# Patient Record
Sex: Female | Born: 1935 | Race: White | Hispanic: No | Smoking: Never smoker
Health system: Southern US, Community
[De-identification: ages and names within clinical notes are randomized; demographics above are authoritative.]

## PROBLEM LIST (undated history)

## (undated) ENCOUNTER — Emergency Department: Admission: EM | Disposition: A | Discharge status: Skilled Nursing Facility | Payer: Medicare Other

## (undated) DIAGNOSIS — I639 Cerebral infarction, unspecified: Secondary | ICD-10-CM

## (undated) DIAGNOSIS — E039 Hypothyroidism, unspecified: Secondary | ICD-10-CM

## (undated) DIAGNOSIS — E785 Hyperlipidemia, unspecified: Secondary | ICD-10-CM

## (undated) DIAGNOSIS — G459 Transient cerebral ischemic attack, unspecified: Secondary | ICD-10-CM

## (undated) DIAGNOSIS — M810 Age-related osteoporosis without current pathological fracture: Secondary | ICD-10-CM

## (undated) DIAGNOSIS — F329 Major depressive disorder, single episode, unspecified: Secondary | ICD-10-CM

## (undated) DIAGNOSIS — I739 Peripheral vascular disease, unspecified: Secondary | ICD-10-CM

## (undated) DIAGNOSIS — D649 Anemia, unspecified: Secondary | ICD-10-CM

## (undated) DIAGNOSIS — F209 Schizophrenia, unspecified: Secondary | ICD-10-CM

## (undated) DIAGNOSIS — F319 Bipolar disorder, unspecified: Secondary | ICD-10-CM

## (undated) DIAGNOSIS — N289 Disorder of kidney and ureter, unspecified: Secondary | ICD-10-CM

## (undated) HISTORY — PX: JOINT REPLACEMENT: SHX530

---

## 2004-06-16 ENCOUNTER — Ambulatory Visit: Payer: Self-pay | Admitting: Internal Medicine

## 2004-06-27 ENCOUNTER — Ambulatory Visit: Payer: Self-pay | Admitting: Internal Medicine

## 2004-07-21 ENCOUNTER — Ambulatory Visit: Payer: Self-pay | Admitting: Internal Medicine

## 2004-09-09 ENCOUNTER — Ambulatory Visit: Payer: Self-pay | Admitting: Internal Medicine

## 2004-10-14 ENCOUNTER — Other Ambulatory Visit: Payer: Self-pay

## 2004-10-14 ENCOUNTER — Ambulatory Visit: Payer: Self-pay | Admitting: Surgery

## 2004-10-20 ENCOUNTER — Ambulatory Visit: Payer: Self-pay | Admitting: Surgery

## 2004-12-23 ENCOUNTER — Ambulatory Visit: Payer: Self-pay | Admitting: Internal Medicine

## 2005-01-19 ENCOUNTER — Ambulatory Visit: Payer: Self-pay | Admitting: Internal Medicine

## 2005-12-21 ENCOUNTER — Ambulatory Visit: Payer: Self-pay | Admitting: Internal Medicine

## 2005-12-24 ENCOUNTER — Emergency Department: Payer: Self-pay | Admitting: Emergency Medicine

## 2005-12-24 ENCOUNTER — Other Ambulatory Visit: Payer: Self-pay

## 2006-09-13 ENCOUNTER — Ambulatory Visit: Payer: Self-pay | Admitting: Gastroenterology

## 2007-02-27 ENCOUNTER — Ambulatory Visit: Payer: Self-pay | Admitting: Internal Medicine

## 2007-05-10 ENCOUNTER — Emergency Department: Payer: Self-pay | Admitting: Emergency Medicine

## 2007-05-14 ENCOUNTER — Emergency Department: Payer: Self-pay | Admitting: Emergency Medicine

## 2007-05-14 ENCOUNTER — Other Ambulatory Visit: Payer: Self-pay

## 2007-05-17 ENCOUNTER — Other Ambulatory Visit: Payer: Self-pay

## 2007-05-17 ENCOUNTER — Inpatient Hospital Stay: Payer: Self-pay | Admitting: Internal Medicine

## 2007-07-12 ENCOUNTER — Emergency Department: Payer: Self-pay | Admitting: Emergency Medicine

## 2007-07-20 ENCOUNTER — Other Ambulatory Visit: Payer: Self-pay

## 2007-07-21 ENCOUNTER — Inpatient Hospital Stay: Payer: Self-pay | Admitting: Internal Medicine

## 2007-08-03 ENCOUNTER — Inpatient Hospital Stay: Payer: Self-pay | Admitting: Internal Medicine

## 2007-08-03 ENCOUNTER — Other Ambulatory Visit: Payer: Self-pay

## 2008-03-03 ENCOUNTER — Ambulatory Visit: Payer: Self-pay | Admitting: Internal Medicine

## 2008-08-16 ENCOUNTER — Emergency Department: Payer: Self-pay | Admitting: Emergency Medicine

## 2008-08-22 ENCOUNTER — Emergency Department: Payer: Self-pay | Admitting: Emergency Medicine

## 2008-11-04 ENCOUNTER — Inpatient Hospital Stay: Payer: Self-pay | Admitting: Internal Medicine

## 2008-11-04 ENCOUNTER — Ambulatory Visit: Payer: Self-pay | Admitting: Internal Medicine

## 2008-11-16 ENCOUNTER — Emergency Department: Payer: Self-pay | Admitting: Unknown Physician Specialty

## 2008-12-01 ENCOUNTER — Ambulatory Visit: Payer: Self-pay | Admitting: Internal Medicine

## 2009-04-30 ENCOUNTER — Inpatient Hospital Stay: Payer: Self-pay | Admitting: Internal Medicine

## 2009-05-10 ENCOUNTER — Emergency Department: Payer: Self-pay | Admitting: Internal Medicine

## 2009-05-19 ENCOUNTER — Inpatient Hospital Stay: Payer: Self-pay | Admitting: Psychiatry

## 2009-08-09 ENCOUNTER — Inpatient Hospital Stay: Payer: Self-pay | Admitting: Internal Medicine

## 2010-01-15 ENCOUNTER — Ambulatory Visit: Payer: Self-pay | Admitting: Family Medicine

## 2010-03-18 IMAGING — CR DG LUMBAR SPINE 2-3V
1 series · 3 of 3 positions shown · non-contrast
Comparison: none

REASON FOR EXAM: FALL, LBP
COMMENTS:   May transport without cardiac monitor

PROCEDURE:     DXR - DXR LUMBAR SPINE AP AND LATERAL  - August 08, 2009 [DATE]
RESULT:     Diffuse degenerative change is noted of the lumbar spine. There
is no evidence of fracture. Aortoiliac atherosclerotic vascular disease is
present.

[Series 1: view not recorded · 0.17mm/px · 3 of 3 slices shown]
[im 1/3]
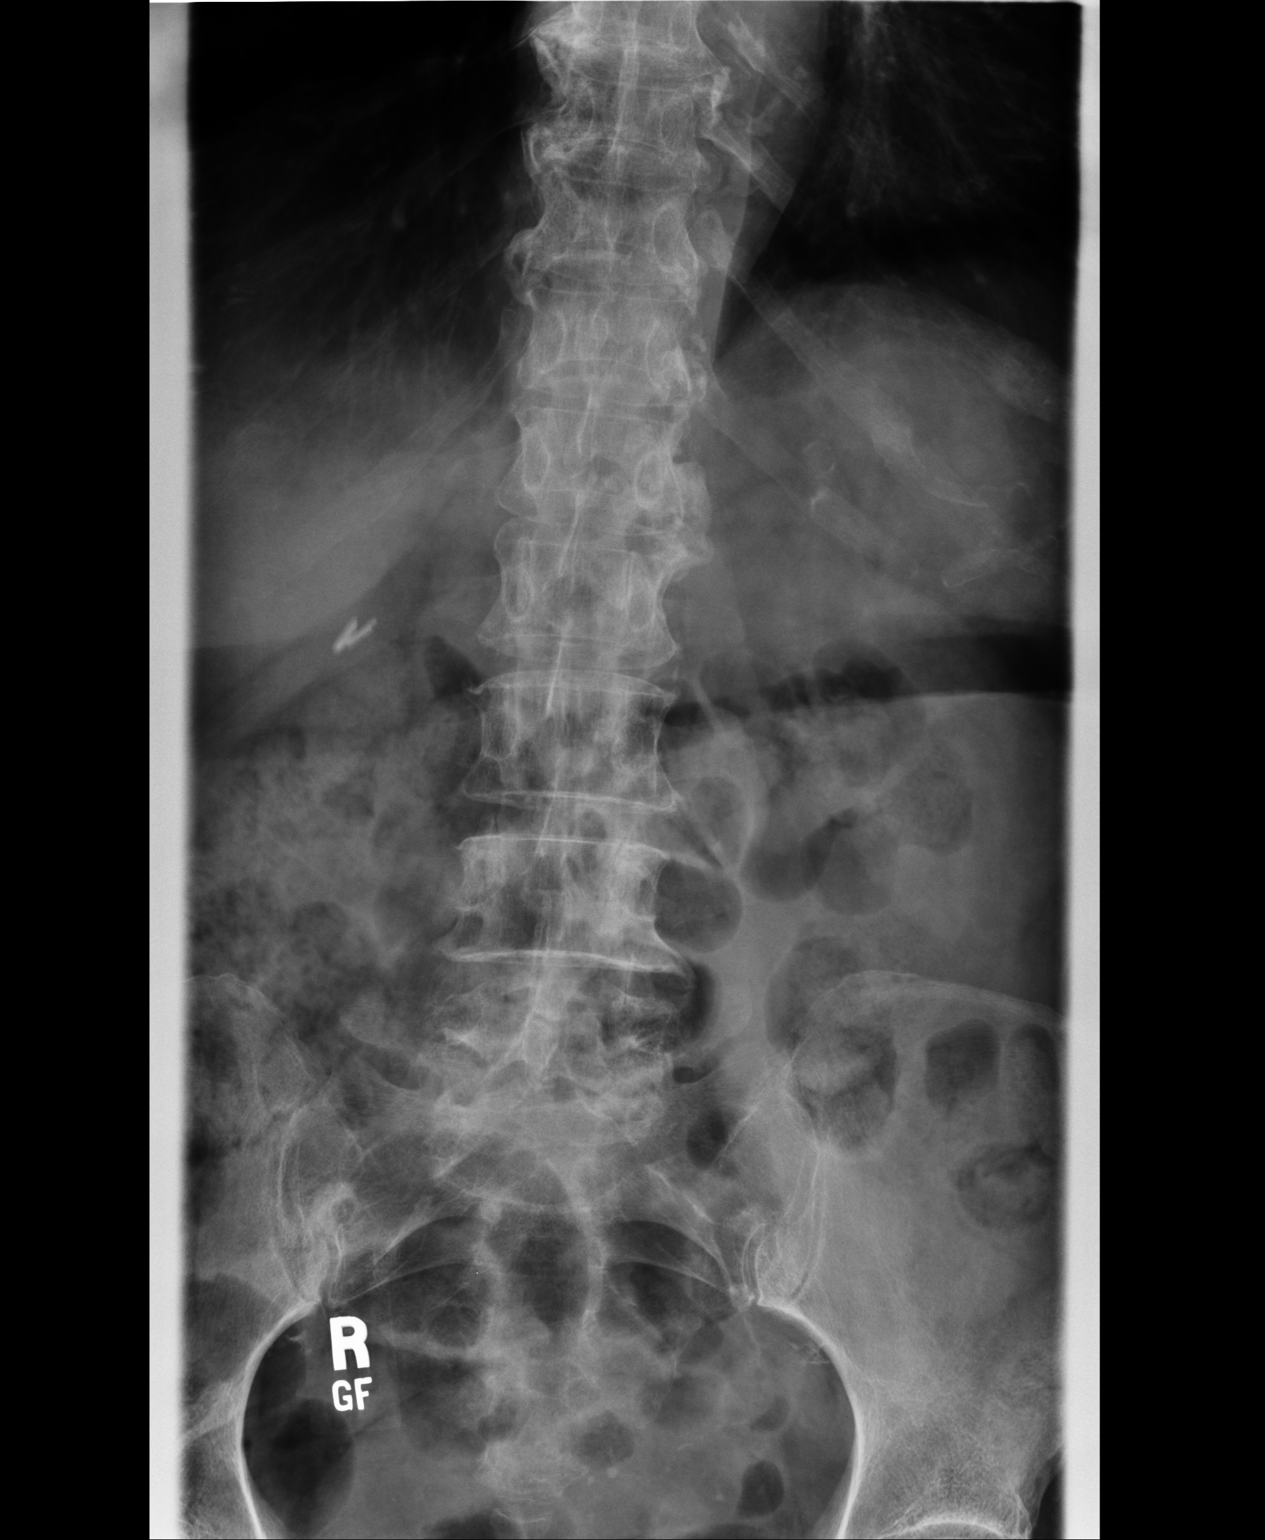
[im 2/3]
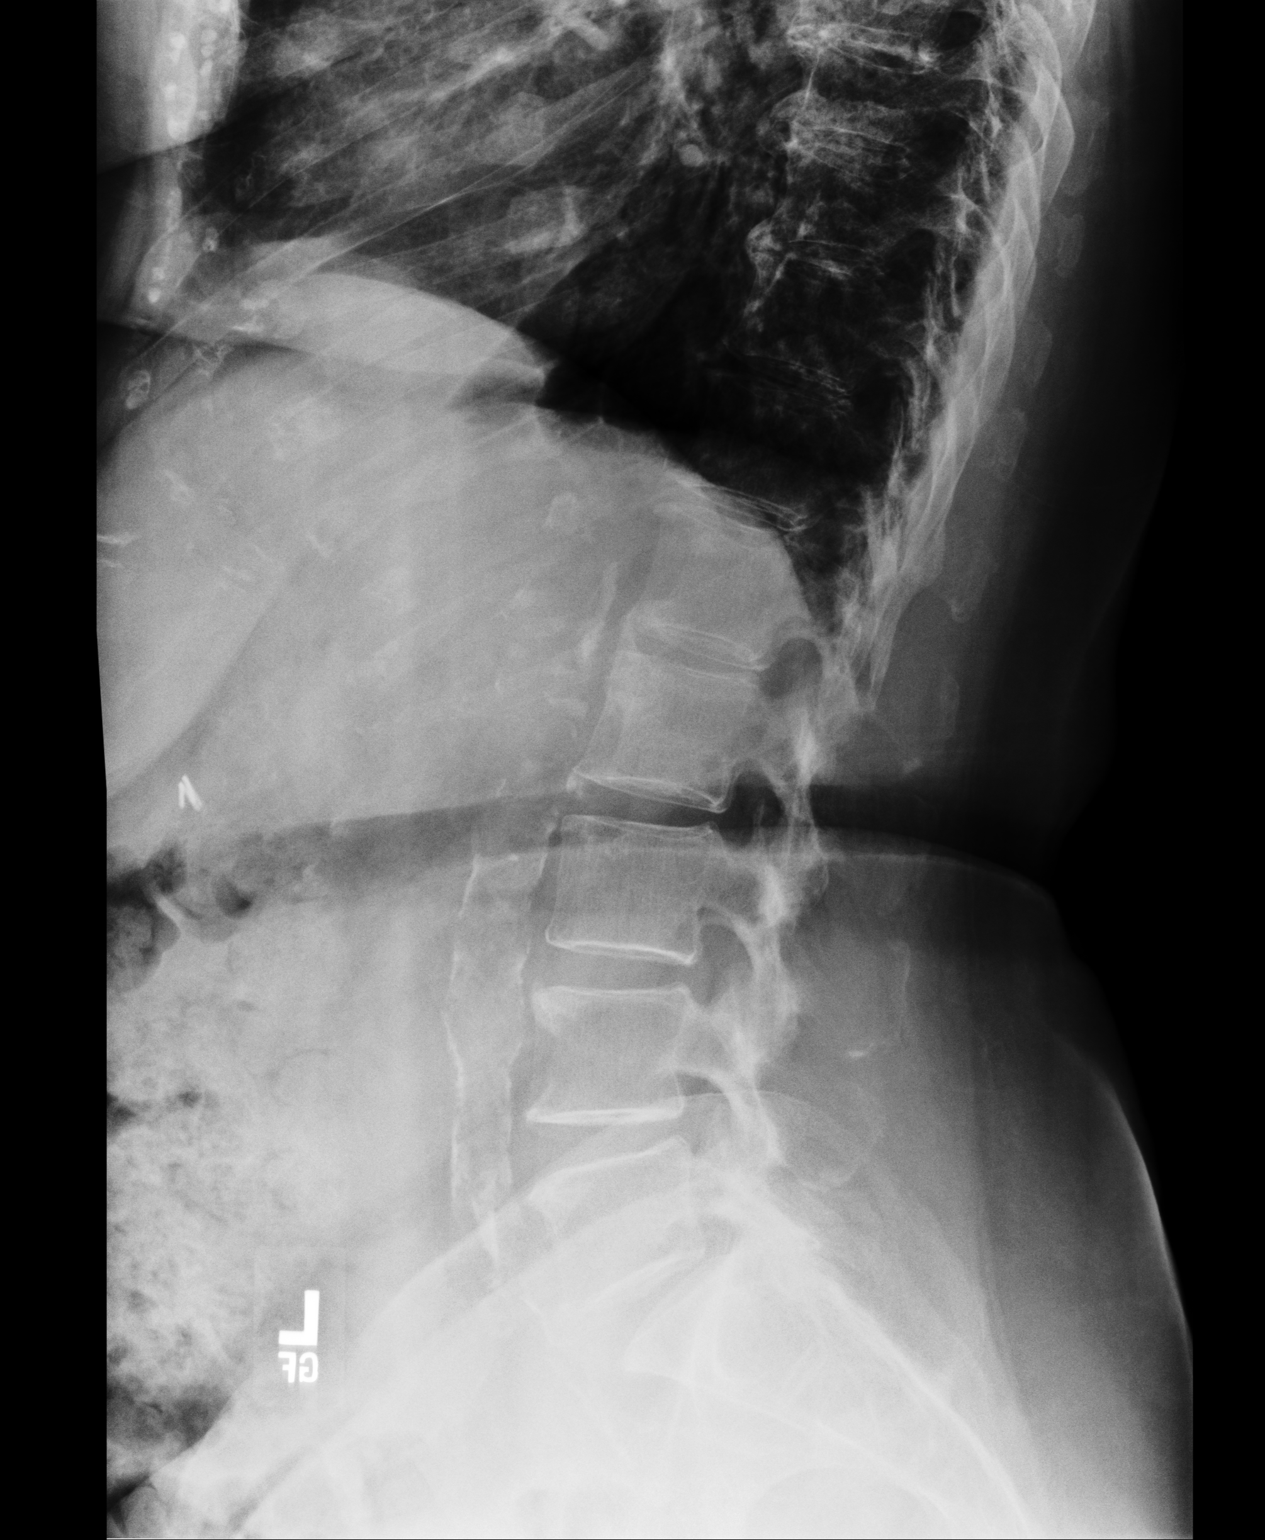
[im 3/3]
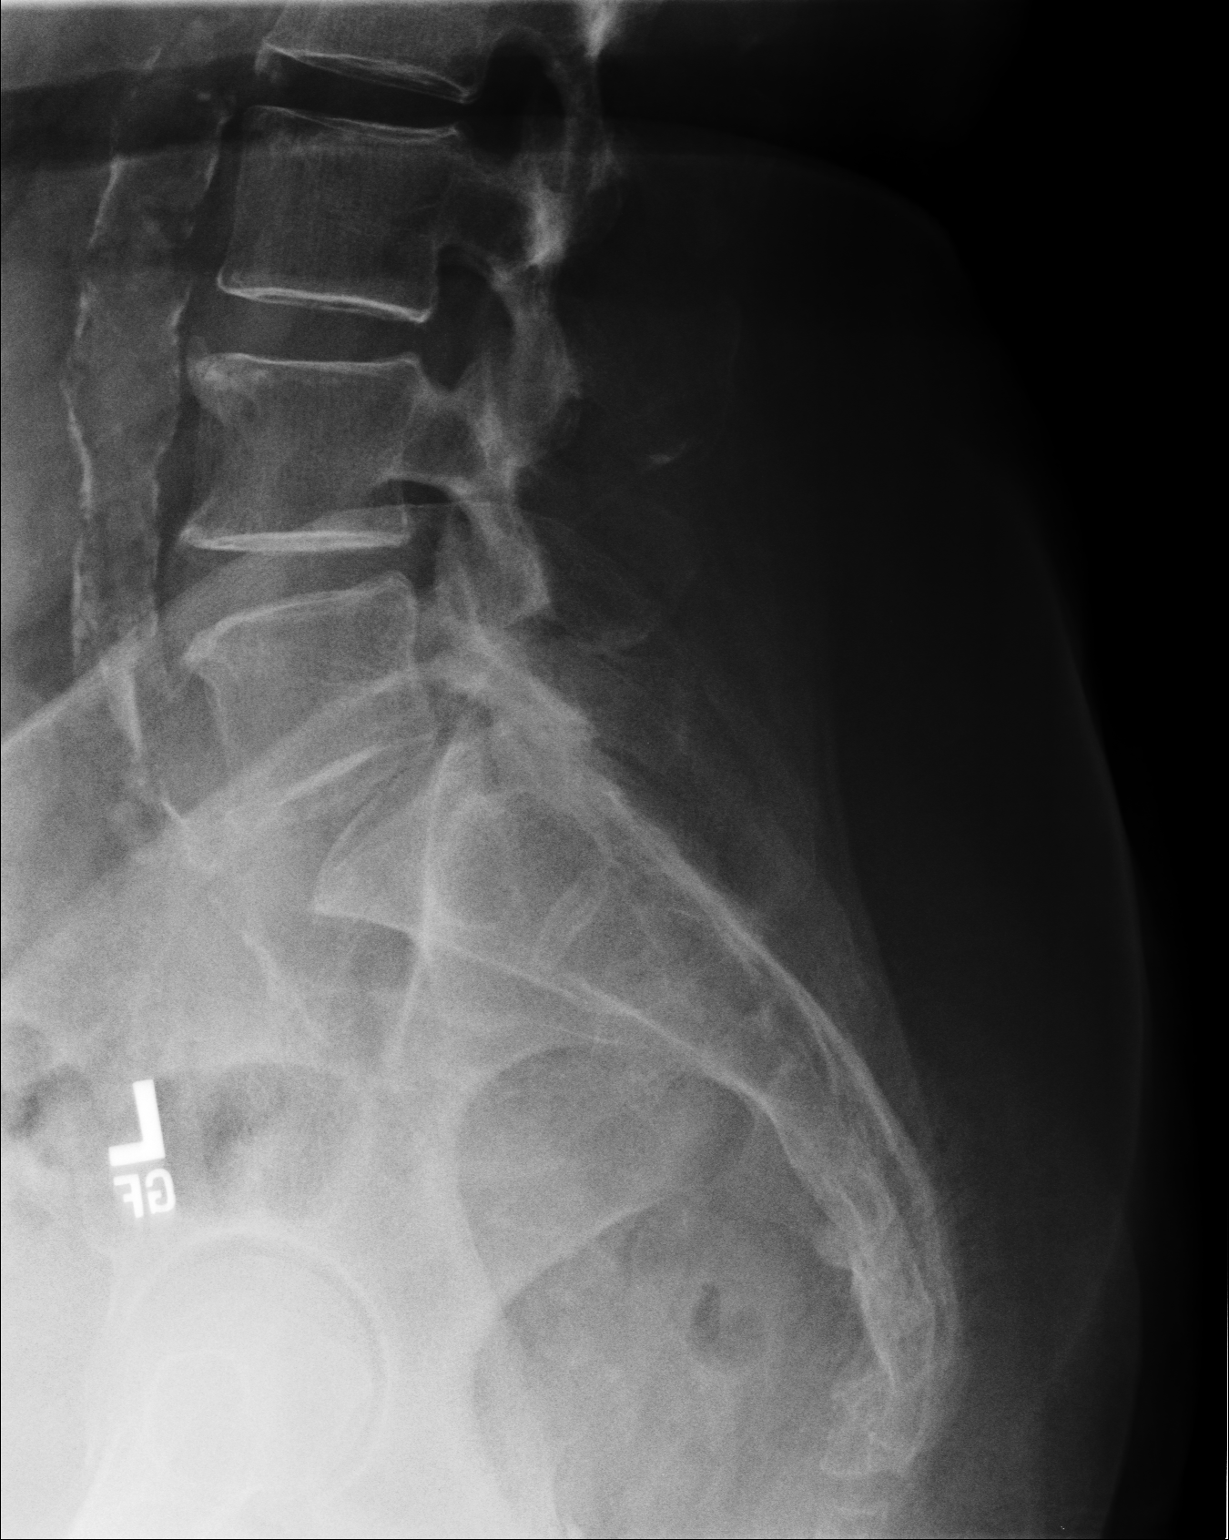

[3 of 3 positions shown; findings below may reference images not displayed]

IMPRESSION: Diffuse degenerative change, no acute abnormality.

## 2010-03-18 IMAGING — CT CT HEAD WITHOUT CONTRAST
2 series · 15 of 30 positions shown, 19 images · non-contrast
Comparison: none

REASON FOR EXAM: FALL, HIT HEAD
COMMENTS:   May transport without cardiac monitor

[Series 2: without · axial · non-contrast · 0.43mm/px · z∈[-140,-14]mm · 13 of 31 slices shown, 17 images]
[im 3/31  brain]
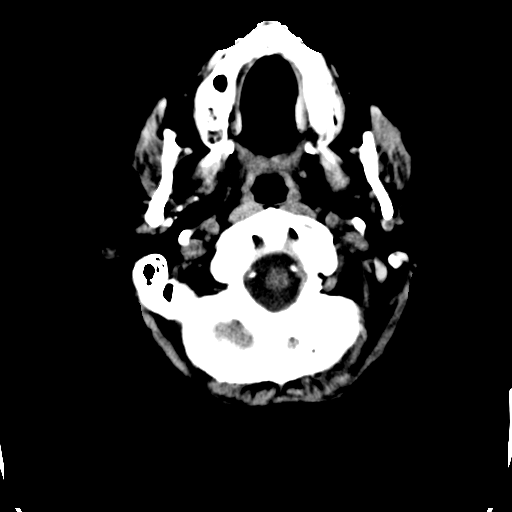
[im 3/31  bone]
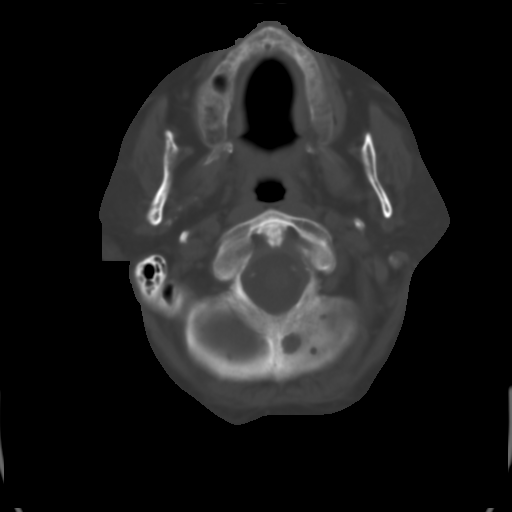
[im 5/31  brain]
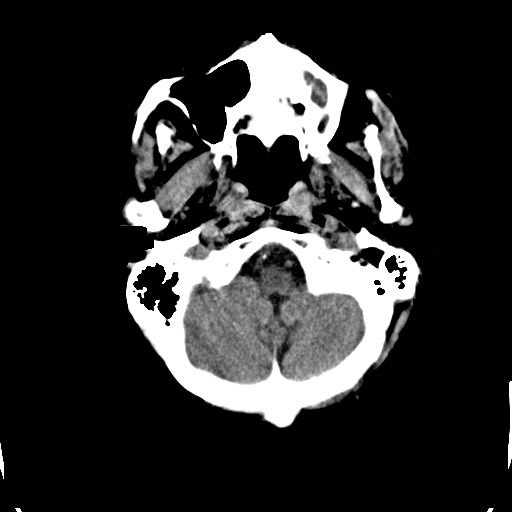
[im 7/31  brain]
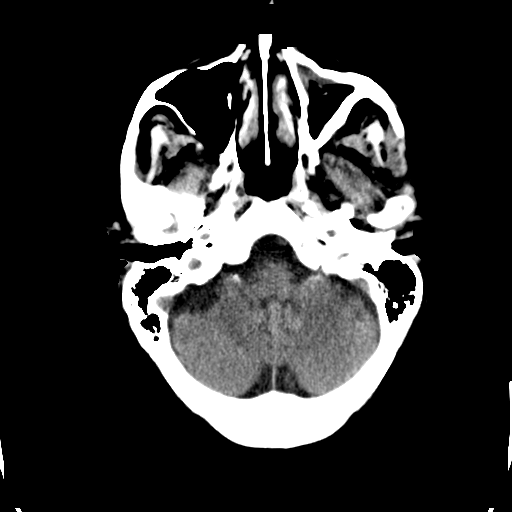
[im 9/31  brain]
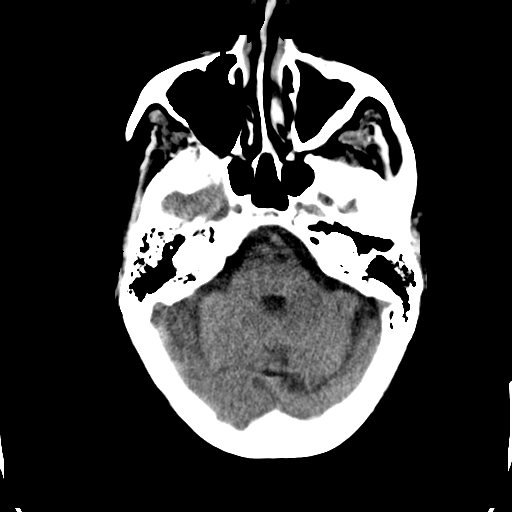
[im 11/31  brain]
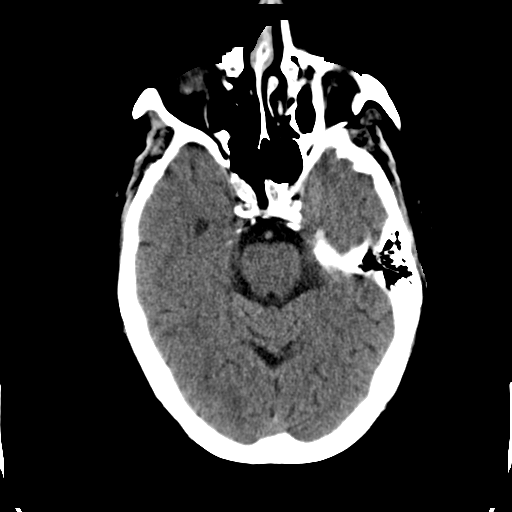
[im 11/31  bone]
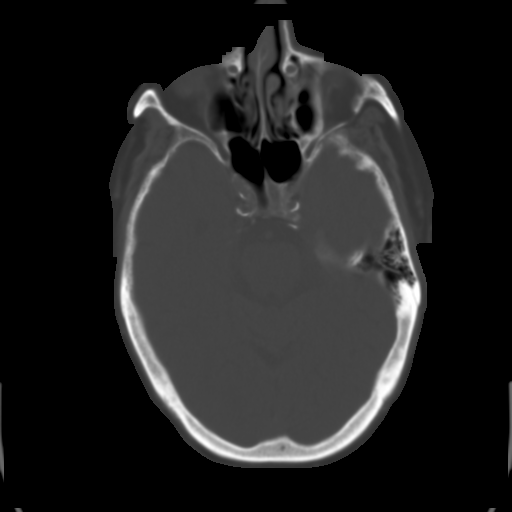
[im 13/31  brain]
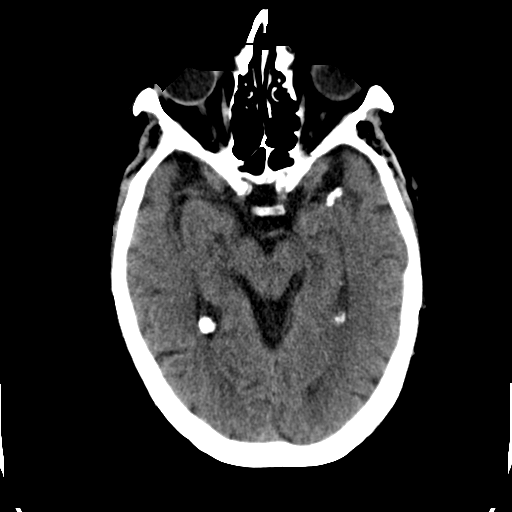
[im 16/31  brain]
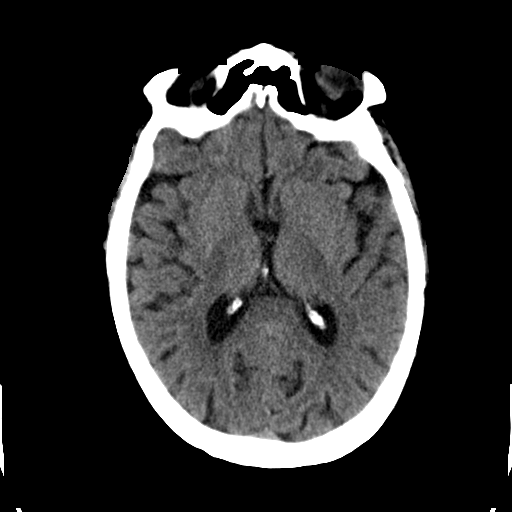
[im 18/31  brain]
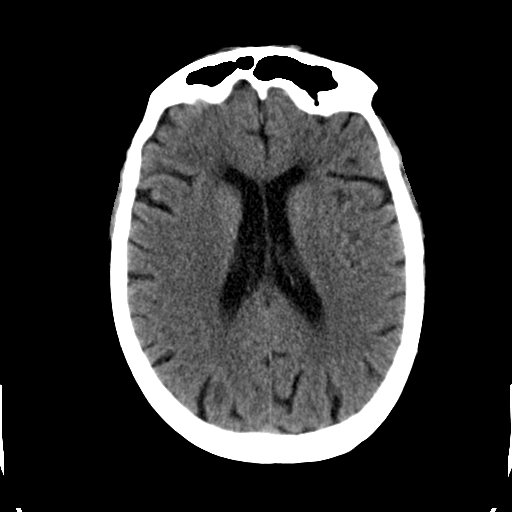
[im 20/31  brain]
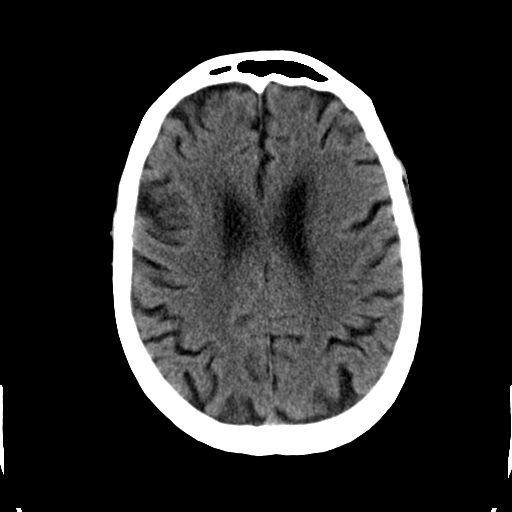
[im 20/31  bone]
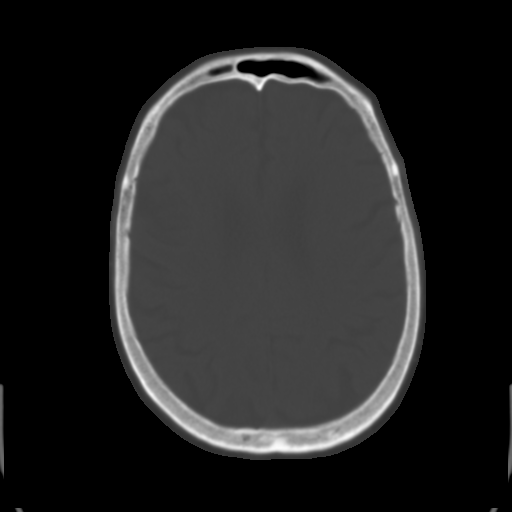
[im 22/31  brain]
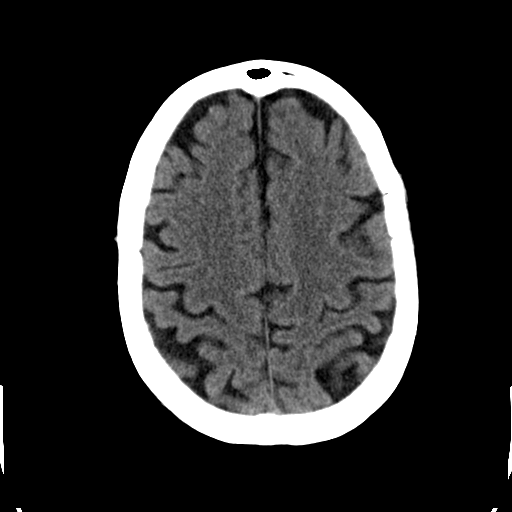
[im 24/31  brain]
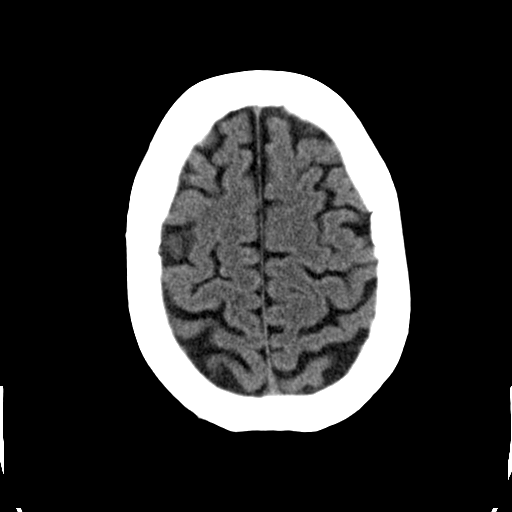
[im 26/31  brain]
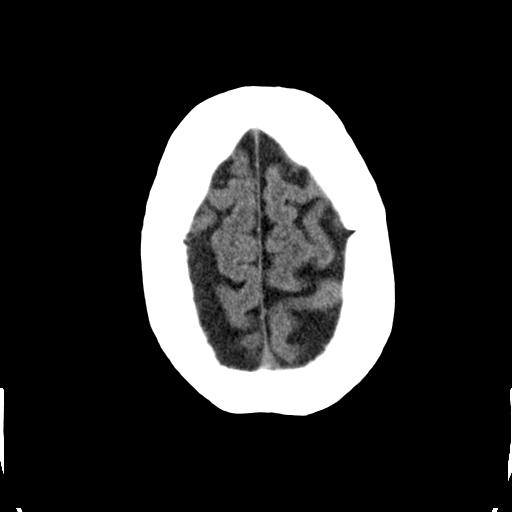
[im 28/31  brain]
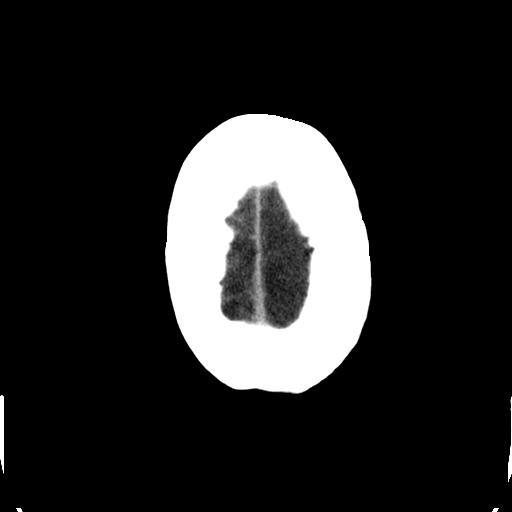
[im 28/31  bone]
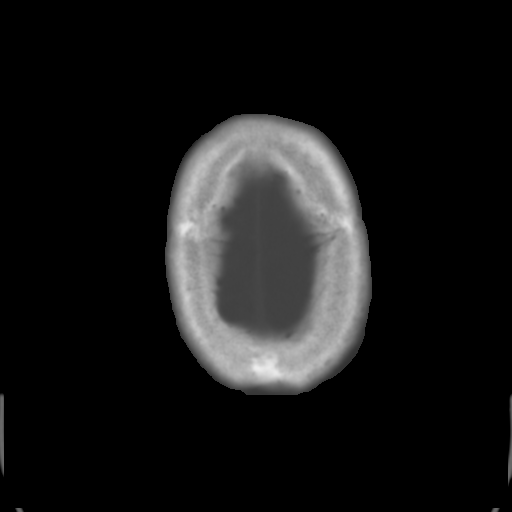

[Series 3: bone · axial · 0.43mm/px · z∈[-140,-120]mm · 2 of 31 slices shown]
[im 3/31  bone]
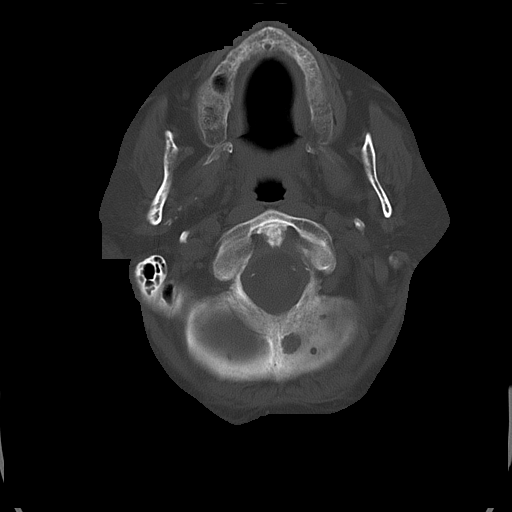
[im 7/31  bone]
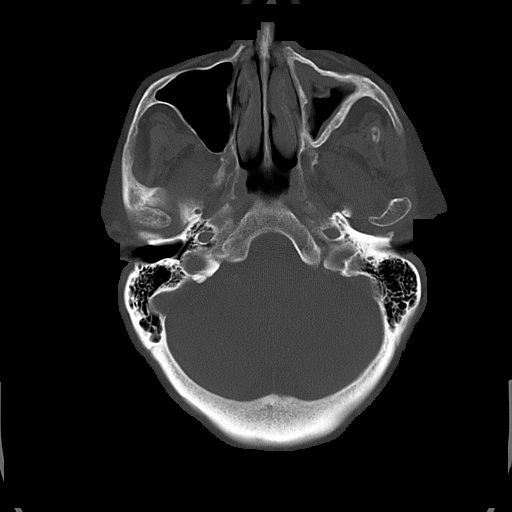

[15 of 30 positions shown; findings below may reference images not displayed]

PROCEDURE:     CT  - CT HEAD WITHOUT CONTRAST  - August 09, 2009 [DATE]

RESULT:     Axial noncontrast CT scanning was performed through the brain at
5 mm intervals and slice thicknesses.

The ventricles are normal in size and position. Mild age-appropriate
cerebral and cerebellar atrophy is present. There is no evidence of an acute
intracranial hemorrhage nor an acute ischemic infarction. Dense
calcification in the region of the left middle cerebral artery is present
but no surrounding hemorrhage is identified. The cerebellum and brainstem
exhibit no acute abnormality. At bone window settings there is
mucoperiosteal thickening within the left maxillary sinus. The mastoid air
cells are well pneumatized. There is no evidence of an acute skull fracture.
IMPRESSION: 1. I do not see evidence of an acute ischemic or hemorrhagic event.
2. There is no evidence of an intracranial mass nor hydrocephalus.
3. There is atherosclerotic calcification of portions of the left middle
cerebral artery.

A preliminary report was sent to the [HOSPITAL] the conclusion
of the study.

## 2010-03-20 IMAGING — CR DG CHEST 2V
1 series · 3 of 3 positions shown · non-contrast
Comparison: none

REASON FOR EXAM: fall
COMMENTS:

[Series 1: view not recorded · 0.17mm/px · 3 of 3 slices shown]
[im 1/3]
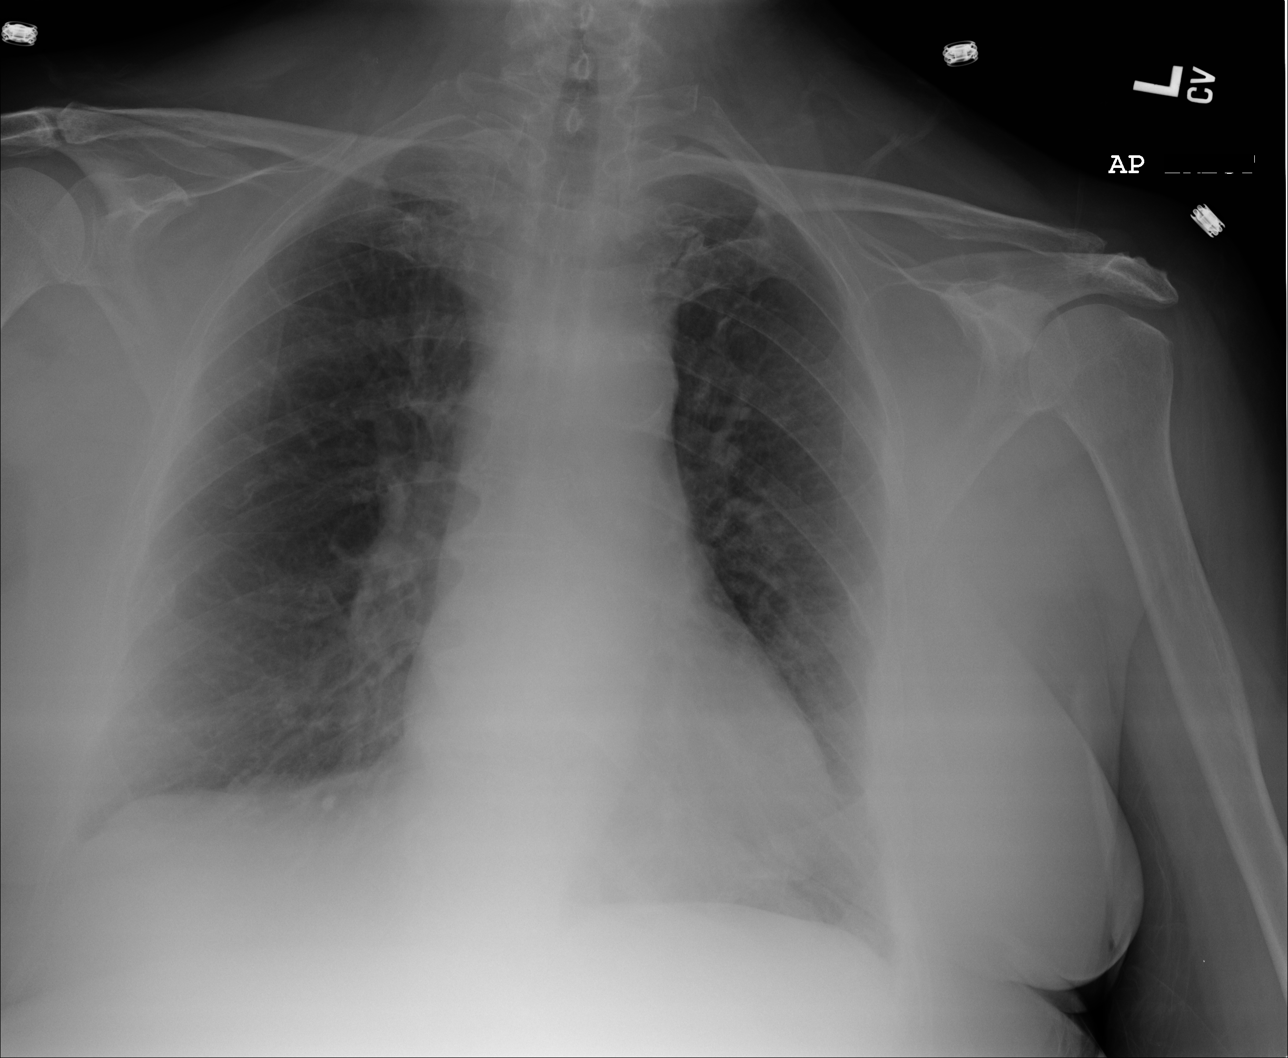
[im 2/3]
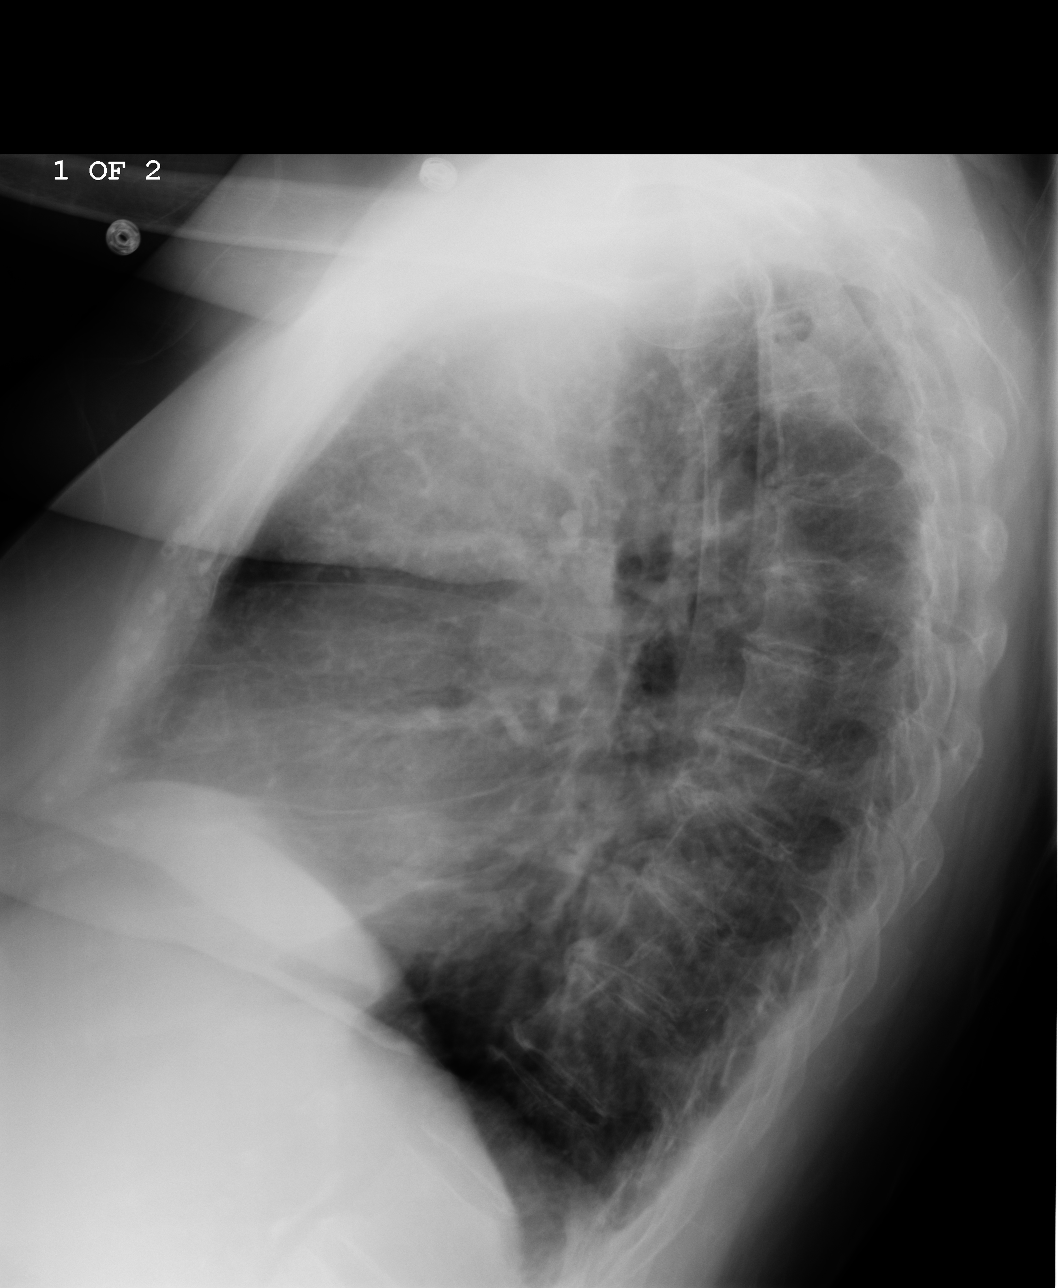
[im 3/3]
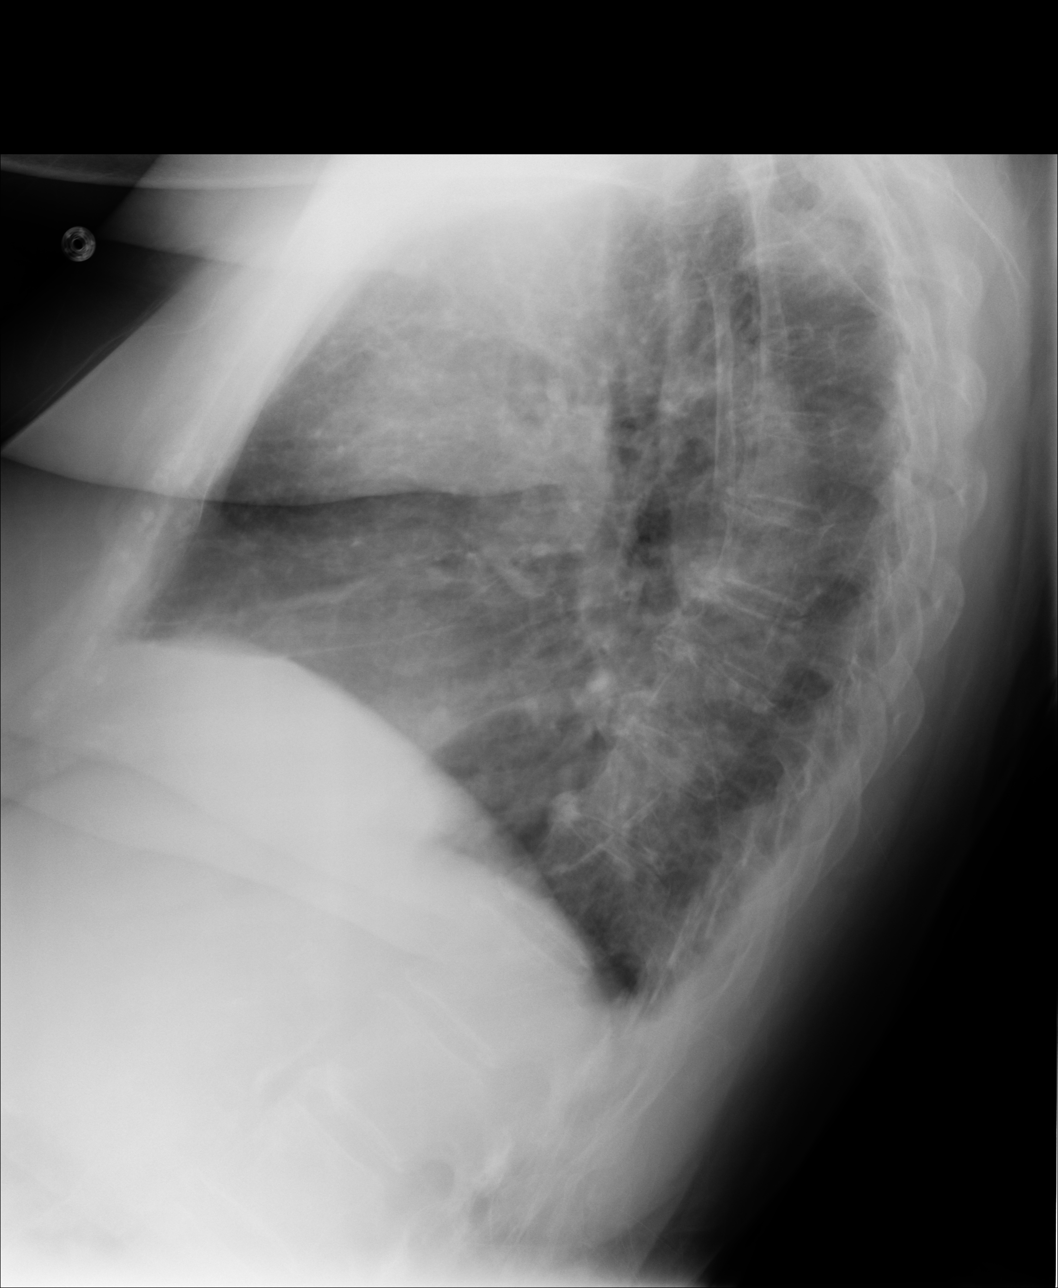

[3 of 3 positions shown; findings below may reference images not displayed]

PROCEDURE:     DXR - DXR CHEST PA (OR AP) AND LATERAL  - August 10, 2009  [DATE]

RESULT:     Comparison is made to the study 08 August, 2009.

The lungs are mildly hyperinflated. The interstitial markings are increased
but not greatly changed from yesterday's study allowing for differences in
radiographic technique. I see no definite pleural effusion. The cardiac
silhouette is top normal in size. The pulmonary vascularity is not engorged.
The bones are osteopenic. Mild loss of height of a mid thoracic vertebral
body is present.
IMPRESSION: Overall I do not see significant interval change in the
appearance of the chest since yesterday's study. There are findings
consistent with underlying COPD. I cannot exclude superimposed low-grade CHF
in the appropriate clinical setting.

## 2011-11-19 ENCOUNTER — Emergency Department: Payer: Self-pay | Admitting: Emergency Medicine

## 2011-11-19 LAB — CBC
HCT: 31.7 % — ABNORMAL LOW (ref 35.0–47.0)
HGB: 10.7 g/dL — ABNORMAL LOW (ref 12.0–16.0)
MCHC: 33.7 g/dL (ref 32.0–36.0)
MCV: 95 fL (ref 80–100)
RDW: 14.1 % (ref 11.5–14.5)
WBC: 5.3 10*3/uL (ref 3.6–11.0)

## 2011-11-19 LAB — URINALYSIS, COMPLETE
Bilirubin,UR: NEGATIVE
Glucose,UR: NEGATIVE mg/dL (ref 0–75)
Granular Cast: 13
Ph: 7 (ref 4.5–8.0)
RBC,UR: 25 /HPF (ref 0–5)
Specific Gravity: 1.01 (ref 1.003–1.030)
Squamous Epithelial: 3

## 2011-11-19 LAB — COMPREHENSIVE METABOLIC PANEL
Albumin: 3.5 g/dL (ref 3.4–5.0)
Alkaline Phosphatase: 51 U/L (ref 50–136)
Anion Gap: 11 (ref 7–16)
Bilirubin,Total: 0.4 mg/dL (ref 0.2–1.0)
Creatinine: 1.45 mg/dL — ABNORMAL HIGH (ref 0.60–1.30)
EGFR (African American): 45 — ABNORMAL LOW
EGFR (Non-African Amer.): 37 — ABNORMAL LOW
Osmolality: 292 (ref 275–301)
SGOT(AST): 21 U/L (ref 15–37)
SGPT (ALT): 15 U/L
Sodium: 142 mmol/L (ref 136–145)
Total Protein: 7 g/dL (ref 6.4–8.2)

## 2011-11-19 LAB — CK TOTAL AND CKMB (NOT AT ARMC)
CK, Total: 71 U/L (ref 21–215)
CK-MB: 1.3 ng/mL (ref 0.5–3.6)

## 2013-07-20 LAB — URINALYSIS, COMPLETE
Nitrite: NEGATIVE
Protein: 30
RBC,UR: 3 /HPF (ref 0–5)
Specific Gravity: 1.013 (ref 1.003–1.030)
Squamous Epithelial: 1
WBC UR: 137 /HPF (ref 0–5)

## 2013-07-20 LAB — COMPREHENSIVE METABOLIC PANEL
Albumin: 3.1 g/dL — ABNORMAL LOW (ref 3.4–5.0)
Alkaline Phosphatase: 66 U/L
BUN: 19 mg/dL — ABNORMAL HIGH (ref 7–18)
Bilirubin,Total: 0.7 mg/dL (ref 0.2–1.0)
Creatinine: 1.44 mg/dL — ABNORMAL HIGH (ref 0.60–1.30)
EGFR (Non-African Amer.): 33 — ABNORMAL LOW
Osmolality: 273 (ref 275–301)
Potassium: 3.3 mmol/L — ABNORMAL LOW (ref 3.5–5.1)
SGOT(AST): 26 U/L (ref 15–37)
Sodium: 135 mmol/L — ABNORMAL LOW (ref 136–145)

## 2013-07-20 LAB — CBC
HGB: 10.8 g/dL — ABNORMAL LOW (ref 12.0–16.0)
MCHC: 35 g/dL (ref 32.0–36.0)
MCV: 89 fL (ref 80–100)
Platelet: 144 10*3/uL — ABNORMAL LOW (ref 150–440)
RBC: 3.47 10*6/uL — ABNORMAL LOW (ref 3.80–5.20)
RDW: 13.8 % (ref 11.5–14.5)
WBC: 4.3 10*3/uL (ref 3.6–11.0)

## 2013-07-20 LAB — CK TOTAL AND CKMB (NOT AT ARMC)
CK, Total: 52 U/L (ref 21–215)
CK-MB: 1.1 ng/mL (ref 0.5–3.6)

## 2013-07-20 LAB — VALPROIC ACID LEVEL: Valproic Acid: 8 ug/mL — ABNORMAL LOW

## 2013-07-20 LAB — TROPONIN I: Troponin-I: <0.02 ng/mL

## 2013-07-21 ENCOUNTER — Observation Stay: Payer: Self-pay | Admitting: Internal Medicine

## 2013-07-26 LAB — CULTURE, BLOOD (SINGLE)

## 2014-04-18 ENCOUNTER — Emergency Department: Payer: Self-pay | Admitting: Emergency Medicine

## 2014-05-03 ENCOUNTER — Inpatient Hospital Stay: Payer: Self-pay | Admitting: Internal Medicine

## 2014-05-03 LAB — CBC WITH DIFFERENTIAL/PLATELET
Basophil #: 0 10*3/uL (ref 0.0–0.1)
Basophil %: 0.3 %
Eosinophil #: 0 10*3/uL (ref 0.0–0.7)
Eosinophil %: 0 %
HCT: 38.6 % (ref 35.0–47.0)
HGB: 13.2 g/dL (ref 12.0–16.0)
LYMPHS ABS: 0.9 10*3/uL — AB (ref 1.0–3.6)
LYMPHS PCT: 6.2 %
MCH: 30.5 pg (ref 26.0–34.0)
MCHC: 34.2 g/dL (ref 32.0–36.0)
MCV: 89 fL (ref 80–100)
MONO ABS: 0.6 x10 3/mm (ref 0.2–0.9)
Monocyte %: 4.4 %
NEUTROS ABS: 12.5 10*3/uL — AB (ref 1.4–6.5)
NEUTROS PCT: 89.1 %
Platelet: 189 10*3/uL (ref 150–440)
RBC: 4.33 10*6/uL (ref 3.80–5.20)
RDW: 14.2 % (ref 11.5–14.5)
WBC: 14 10*3/uL — ABNORMAL HIGH (ref 3.6–11.0)

## 2014-05-03 LAB — BASIC METABOLIC PANEL
Anion Gap: 10 (ref 7–16)
BUN: 32 mg/dL — ABNORMAL HIGH (ref 7–18)
CREATININE: 1.77 mg/dL — AB (ref 0.60–1.30)
Calcium, Total: 9.7 mg/dL (ref 8.5–10.1)
Chloride: 96 mmol/L — ABNORMAL LOW (ref 98–107)
Co2: 26 mmol/L (ref 21–32)
EGFR (Non-African Amer.): 26 — ABNORMAL LOW
GFR CALC AF AMER: 30 — AB
Glucose: 353 mg/dL — ABNORMAL HIGH (ref 65–99)
OSMOLALITY: 286 (ref 275–301)
Potassium: 3.7 mmol/L (ref 3.5–5.1)
SODIUM: 132 mmol/L — AB (ref 136–145)

## 2014-05-03 LAB — URINALYSIS, COMPLETE
BILIRUBIN, UR: NEGATIVE
Glucose,UR: >=500 mg/dL (ref 0–75)
Nitrite: POSITIVE
PH: 5 (ref 4.5–8.0)
Protein: 30
RBC,UR: 10 /HPF (ref 0–5)
Specific Gravity: 1.011 (ref 1.003–1.030)
Squamous Epithelial: 1
WBC UR: 246 /HPF (ref 0–5)

## 2014-05-03 LAB — APTT: Activated PTT: 31.5 secs (ref 23.6–35.9)

## 2014-05-03 LAB — TROPONIN I
TROPONIN-I: 0.31 ng/mL — AB
Troponin-I: 0.3 ng/mL — ABNORMAL HIGH
Troponin-I: 0.35 ng/mL — ABNORMAL HIGH

## 2014-05-03 LAB — PROTIME-INR
INR: 1.1
PROTHROMBIN TIME: 13.9 s (ref 11.5–14.7)

## 2014-05-03 LAB — CK-MB
CK-MB: 17.7 ng/mL — AB (ref 0.5–3.6)
CK-MB: 26.5 ng/mL — AB (ref 0.5–3.6)
CK-MB: 23.9 ng/mL — ABNORMAL HIGH (ref 0.5–3.6)

## 2014-05-03 LAB — CK: CK, TOTAL: 730 U/L — AB

## 2014-05-04 LAB — BASIC METABOLIC PANEL
Anion Gap: 9 (ref 7–16)
BUN: 16 mg/dL (ref 7–18)
CO2: 25 mmol/L (ref 21–32)
CREATININE: 0.92 mg/dL (ref 0.60–1.30)
Calcium, Total: 8.5 mg/dL (ref 8.5–10.1)
Chloride: 104 mmol/L (ref 98–107)
EGFR (African American): >60
EGFR (Non-African Amer.): 57 — ABNORMAL LOW
GLUCOSE: 204 mg/dL — AB (ref 65–99)
Osmolality: 283 (ref 275–301)
POTASSIUM: 3.1 mmol/L — AB (ref 3.5–5.1)
SODIUM: 138 mmol/L (ref 136–145)

## 2014-05-04 LAB — CBC WITH DIFFERENTIAL/PLATELET
BASOS PCT: 0.4 %
Basophil #: 0 10*3/uL (ref 0.0–0.1)
EOS ABS: 0.1 10*3/uL (ref 0.0–0.7)
EOS PCT: 1.1 %
HCT: 33.2 % — AB (ref 35.0–47.0)
HGB: 11.5 g/dL — ABNORMAL LOW (ref 12.0–16.0)
Lymphocyte #: 1.2 10*3/uL (ref 1.0–3.6)
Lymphocyte %: 16.7 %
MCH: 31.2 pg (ref 26.0–34.0)
MCHC: 34.6 g/dL (ref 32.0–36.0)
MCV: 90 fL (ref 80–100)
Monocyte #: 0.6 x10 3/mm (ref 0.2–0.9)
Monocyte %: 8.5 %
NEUTROS PCT: 73.3 %
Neutrophil #: 5.3 10*3/uL (ref 1.4–6.5)
Platelet: 146 10*3/uL — ABNORMAL LOW (ref 150–440)
RBC: 3.67 10*6/uL — AB (ref 3.80–5.20)
RDW: 13.6 % (ref 11.5–14.5)
WBC: 7.2 10*3/uL (ref 3.6–11.0)

## 2014-05-04 LAB — MAGNESIUM: Magnesium: 1.4 mg/dL — ABNORMAL LOW

## 2014-05-05 LAB — BASIC METABOLIC PANEL
Anion Gap: 6 — ABNORMAL LOW (ref 7–16)
BUN: 18 mg/dL (ref 7–18)
CHLORIDE: 104 mmol/L (ref 98–107)
Calcium, Total: 8.9 mg/dL (ref 8.5–10.1)
Co2: 29 mmol/L (ref 21–32)
Creatinine: 0.98 mg/dL (ref 0.60–1.30)
EGFR (Non-African Amer.): 53 — ABNORMAL LOW
Glucose: 169 mg/dL — ABNORMAL HIGH (ref 65–99)
Osmolality: 283 (ref 275–301)
POTASSIUM: 4 mmol/L (ref 3.5–5.1)
Sodium: 139 mmol/L (ref 136–145)

## 2014-05-05 LAB — CBC WITH DIFFERENTIAL/PLATELET
Basophil #: 0 10*3/uL (ref 0.0–0.1)
Basophil %: 0.9 %
EOS ABS: 0.1 10*3/uL (ref 0.0–0.7)
Eosinophil %: 2.6 %
HCT: 33.2 % — ABNORMAL LOW (ref 35.0–47.0)
HGB: 11 g/dL — AB (ref 12.0–16.0)
LYMPHS PCT: 22.6 %
Lymphocyte #: 1.2 10*3/uL (ref 1.0–3.6)
MCH: 30.1 pg (ref 26.0–34.0)
MCHC: 33.2 g/dL (ref 32.0–36.0)
MCV: 90 fL (ref 80–100)
MONOS PCT: 9.2 %
Monocyte #: 0.5 x10 3/mm (ref 0.2–0.9)
NEUTROS ABS: 3.3 10*3/uL (ref 1.4–6.5)
Neutrophil %: 64.7 %
PLATELETS: 153 10*3/uL (ref 150–440)
RBC: 3.67 10*6/uL — ABNORMAL LOW (ref 3.80–5.20)
RDW: 13.8 % (ref 11.5–14.5)
WBC: 5.1 10*3/uL (ref 3.6–11.0)

## 2014-05-05 LAB — MAGNESIUM: Magnesium: 1.5 mg/dL — ABNORMAL LOW

## 2014-05-05 LAB — CK: CK, TOTAL: 209 U/L — AB

## 2014-05-06 LAB — HEPATIC FUNCTION PANEL A (ARMC)
Albumin: 3 g/dL — ABNORMAL LOW (ref 3.4–5.0)
Alkaline Phosphatase: 80 U/L
Bilirubin,Total: 0.5 mg/dL (ref 0.2–1.0)
SGOT(AST): 24 U/L (ref 15–37)
SGPT (ALT): 15 U/L
TOTAL PROTEIN: 6.7 g/dL (ref 6.4–8.2)

## 2014-06-15 ENCOUNTER — Inpatient Hospital Stay: Payer: Self-pay | Admitting: Internal Medicine

## 2014-06-15 LAB — CBC WITH DIFFERENTIAL/PLATELET
BASOS ABS: 0 10*3/uL (ref 0.0–0.1)
Basophil %: 0.5 %
Eosinophil #: 0.1 10*3/uL (ref 0.0–0.7)
Eosinophil %: 1.6 %
HCT: 36.1 % (ref 35.0–47.0)
HGB: 12.2 g/dL (ref 12.0–16.0)
LYMPHS ABS: 1.4 10*3/uL (ref 1.0–3.6)
Lymphocyte %: 17.6 %
MCH: 30.6 pg (ref 26.0–34.0)
MCHC: 33.8 g/dL (ref 32.0–36.0)
MCV: 90 fL (ref 80–100)
Monocyte #: 0.5 x10 3/mm (ref 0.2–0.9)
Monocyte %: 6.6 %
Neutrophil #: 5.8 10*3/uL (ref 1.4–6.5)
Neutrophil %: 73.7 %
PLATELETS: 165 10*3/uL (ref 150–440)
RBC: 3.99 10*6/uL (ref 3.80–5.20)
RDW: 13.9 % (ref 11.5–14.5)
WBC: 7.8 10*3/uL (ref 3.6–11.0)

## 2014-06-15 LAB — COMPREHENSIVE METABOLIC PANEL
ALBUMIN: 3.5 g/dL (ref 3.4–5.0)
AST: 22 U/L (ref 15–37)
Alkaline Phosphatase: 87 U/L
Anion Gap: 9 (ref 7–16)
BILIRUBIN TOTAL: 0.5 mg/dL (ref 0.2–1.0)
BUN: 17 mg/dL (ref 7–18)
CO2: 26 mmol/L (ref 21–32)
Calcium, Total: 9.1 mg/dL (ref 8.5–10.1)
Chloride: 100 mmol/L (ref 98–107)
Creatinine: 0.99 mg/dL (ref 0.60–1.30)
EGFR (African American): >60
EGFR (Non-African Amer.): 57 — ABNORMAL LOW
GLUCOSE: 323 mg/dL — AB (ref 65–99)
Osmolality: 284 (ref 275–301)
POTASSIUM: 3.8 mmol/L (ref 3.5–5.1)
SGPT (ALT): 19 U/L
SODIUM: 135 mmol/L — AB (ref 136–145)
Total Protein: 7.2 g/dL (ref 6.4–8.2)

## 2014-06-15 LAB — PROTIME-INR
INR: 1
Prothrombin Time: 12.9 secs (ref 11.5–14.7)

## 2014-06-15 LAB — URINALYSIS, COMPLETE
Bilirubin,UR: NEGATIVE
Glucose,UR: >=500 mg/dL (ref 0–75)
KETONE: NEGATIVE
Nitrite: NEGATIVE
Ph: 6 (ref 4.5–8.0)
Protein: NEGATIVE
RBC,UR: 5 /HPF (ref 0–5)
SPECIFIC GRAVITY: 1.011 (ref 1.003–1.030)
WBC UR: 56 /HPF (ref 0–5)

## 2014-06-15 LAB — APTT: Activated PTT: 30.4 secs (ref 23.6–35.9)

## 2014-06-15 LAB — CK: CK, TOTAL: 103 U/L

## 2014-06-16 LAB — BASIC METABOLIC PANEL
Anion Gap: 11 (ref 7–16)
BUN: 14 mg/dL (ref 7–18)
CHLORIDE: 101 mmol/L (ref 98–107)
Calcium, Total: 9.1 mg/dL (ref 8.5–10.1)
Co2: 27 mmol/L (ref 21–32)
Creatinine: 0.9 mg/dL (ref 0.60–1.30)
EGFR (African American): >60
EGFR (Non-African Amer.): >60
GLUCOSE: 221 mg/dL — AB (ref 65–99)
Osmolality: 285 (ref 275–301)
POTASSIUM: 3.4 mmol/L — AB (ref 3.5–5.1)
Sodium: 139 mmol/L (ref 136–145)

## 2014-06-16 LAB — CBC WITH DIFFERENTIAL/PLATELET
Basophil #: 0 10*3/uL (ref 0.0–0.1)
Basophil %: 0.6 %
EOS ABS: 0.1 10*3/uL (ref 0.0–0.7)
Eosinophil %: 1.2 %
HCT: 35.3 % (ref 35.0–47.0)
HGB: 11.9 g/dL — AB (ref 12.0–16.0)
Lymphocyte #: 1.6 10*3/uL (ref 1.0–3.6)
Lymphocyte %: 19.4 %
MCH: 30.2 pg (ref 26.0–34.0)
MCHC: 33.6 g/dL (ref 32.0–36.0)
MCV: 90 fL (ref 80–100)
MONOS PCT: 8.4 %
Monocyte #: 0.7 x10 3/mm (ref 0.2–0.9)
Neutrophil #: 5.7 10*3/uL (ref 1.4–6.5)
Neutrophil %: 70.4 %
PLATELETS: 159 10*3/uL (ref 150–440)
RBC: 3.93 10*6/uL (ref 3.80–5.20)
RDW: 14.1 % (ref 11.5–14.5)
WBC: 8 10*3/uL (ref 3.6–11.0)

## 2014-06-16 LAB — POTASSIUM: Potassium: 5 mmol/L (ref 3.5–5.1)

## 2014-06-17 LAB — CBC WITH DIFFERENTIAL/PLATELET
Basophil #: 0 10*3/uL (ref 0.0–0.1)
Basophil %: 0.2 %
EOS PCT: 0.3 %
Eosinophil #: 0 10*3/uL (ref 0.0–0.7)
HCT: 30.6 % — ABNORMAL LOW (ref 35.0–47.0)
HGB: 10.4 g/dL — AB (ref 12.0–16.0)
Lymphocyte #: 1.2 10*3/uL (ref 1.0–3.6)
Lymphocyte %: 15.9 %
MCH: 30.6 pg (ref 26.0–34.0)
MCHC: 33.8 g/dL (ref 32.0–36.0)
MCV: 90 fL (ref 80–100)
Monocyte #: 0.7 x10 3/mm (ref 0.2–0.9)
Monocyte %: 9.6 %
NEUTROS PCT: 74 %
Neutrophil #: 5.7 10*3/uL (ref 1.4–6.5)
Platelet: 137 10*3/uL — ABNORMAL LOW (ref 150–440)
RBC: 3.39 10*6/uL — AB (ref 3.80–5.20)
RDW: 13.9 % (ref 11.5–14.5)
WBC: 7.6 10*3/uL (ref 3.6–11.0)

## 2014-06-17 LAB — BASIC METABOLIC PANEL
ANION GAP: 8 (ref 7–16)
BUN: 15 mg/dL (ref 7–18)
CALCIUM: 7.8 mg/dL — AB (ref 8.5–10.1)
CO2: 26 mmol/L (ref 21–32)
Chloride: 102 mmol/L (ref 98–107)
Creatinine: 0.96 mg/dL (ref 0.60–1.30)
EGFR (Non-African Amer.): 59 — ABNORMAL LOW
Glucose: 210 mg/dL — ABNORMAL HIGH (ref 65–99)
OSMOLALITY: 279 (ref 275–301)
Potassium: 3.7 mmol/L (ref 3.5–5.1)
SODIUM: 136 mmol/L (ref 136–145)

## 2014-06-18 LAB — URINALYSIS, COMPLETE
BLOOD: NEGATIVE
Bilirubin,UR: NEGATIVE
Glucose,UR: NEGATIVE mg/dL (ref 0–75)
Nitrite: NEGATIVE
PROTEIN: NEGATIVE
Ph: 5 (ref 4.5–8.0)
RBC,UR: 7 /HPF (ref 0–5)
Specific Gravity: 1.017 (ref 1.003–1.030)
WBC UR: 197 /HPF (ref 0–5)

## 2014-06-18 LAB — HEMOGLOBIN: HGB: 9.4 g/dL — ABNORMAL LOW (ref 12.0–16.0)

## 2014-06-19 LAB — BASIC METABOLIC PANEL
ANION GAP: 8 (ref 7–16)
BUN: 14 mg/dL (ref 7–18)
Calcium, Total: 8.3 mg/dL — ABNORMAL LOW (ref 8.5–10.1)
Chloride: 99 mmol/L (ref 98–107)
Co2: 28 mmol/L (ref 21–32)
Creatinine: 0.81 mg/dL (ref 0.60–1.30)
EGFR (Non-African Amer.): >60
Glucose: 121 mg/dL — ABNORMAL HIGH (ref 65–99)
Osmolality: 272 (ref 275–301)
Potassium: 3.6 mmol/L (ref 3.5–5.1)
Sodium: 135 mmol/L — ABNORMAL LOW (ref 136–145)

## 2014-06-23 LAB — URINE CULTURE

## 2014-10-26 ENCOUNTER — Emergency Department: Payer: Self-pay | Admitting: Emergency Medicine

## 2014-12-11 NOTE — Discharge Summary (Signed)
PATIENT NAME:  Leslie Savage, Leslie Savage MR#:  [id number] DATE OF BIRTH:  [birth year]  DATE OF ADMISSION:  07/21/2013 DATE OF DISCHARGE:  07/21/2013  ADMISSION DIAGNOSES: Acute encephalopathy secondary to urinary tract infection.   DISCHARGE DIAGNOSES:  1.  Acute encephalopathy on  chronic cognitive impairment secondary to urinary tract infection.  2.  Urinary tract infection.  3.  Chronic kidney disease, stage III. 4.  Chronic diastolic heart failure.  5.  History of bipolar disorder.   CONSULTATIONS: Psychiatry.   DIAGNOSTIC STUDIES:   1.  MRI of the brain showed no acute abnormality.  2.  Blood cultures are negative to date.   HOSPITAL COURSE: This is a 81 year old female who presented with slurred speech, confusion, and thought to have acute encephalopathy secondary to urinary tract infection. For further details, please refer to the H and P.  1.  Acute encephalopathy. This was thought to be secondary to urinary tract infection. She was started on Rocephin. She does have some baseline cognitive impairment as per my discussion with the son. She also has underlying psychiatric disorder. She underwent an MRI to evaluate for a CVA. This essentially was negative for an acute ischemic event. She also had psychiatric evaluation due to her underlying psychiatric diagnosis and possible medication changes. It appears the patient is actually not compliant with her baseline antipsychotic meds; therefore, Dr. Bary Leriche did not change any of her medications. She did mention that if the patient needs psychiatric treatment, the best place for her to be would be a geri psychiatric unit.  2.  Urinary tract infection. The patient continued on Rocephin. She will be discharged with Keflex.  3.  Chronic kidney disease, stage III.  4.  Chronic diastolic heart failure, which is stable.  5.  History of bipolar affective disorder. As mentioned, Dr. Bary Leriche from psychiatry did see the patient in consultation. The patient  was not cooperative with the psychiatrist. At this time, she did not believe that the patient required any psychiatric hospitalization and said that she could not change the patient's noncompliant behavior.   DISCHARGE MEDICATIONS:  1.  Keflex  571m t.i.d. for 8 days.  2.  Ferritin 10 mg daily.  3.  Fish oil 1 tablet daily. 4.  Vitamins B12 500 mcg daily.  5.  Actoplus Met 500/15 once per day.  6.  Ferrous sulfate 325 mg daily.  7.   40 mg b.i.d.  8.  Depakote 125 mg b.i.d.  9.  Seroquel 400 mg at bedtime.  10.  Seroquel XR 50 mg at bedtime.  11.  Artificial tears, affected eye daily.  12.  Cymbalta 50 mg daily.  13.  Vitamin D3 at 50,000 international units a month.  14.  Synthroid 175 mcg daily.  15.  Latuda 20 mg daily.  16.  Protonix 40 mg daily.  17.  Coreg 6.25 b.i.d.   DISCHARGE DIET: Low fat, low cholesterol diet.   DISCHARGE ACTIVITY: As tolerated.   DISCHARGE REFERRAL: Home health care.   DISCHARGE FOLLOW-UP: The patient will follow up with Dr. MFrazier Richardsin 1 week.   TIME SPENT: Approximately 35 minutes.   CONDITION: The patient is medically stable for discharge.    ____________________________ Omega Durante P. MBenjie Karvonen MD spm:np D: 07/21/2013 20:39:39 ET T: 07/21/2013 23:28:38 ET JOB#: [id number] cc: Bethan Adamek P. MBenjie Karvonen MD, <Dictator> MOcie Cornfield AOuida Sills MD  SDonell BeersMODY MD ELECTRONICALLY SIGNED 07/22/2013 12:54

## 2014-12-11 NOTE — H&P (Signed)
PATIENT NAME:  Leslie Savage, UNCAPHER MR#:  [id number] DATE OF BIRTH:  1927-12-07  DATE OF ADMISSION:  07/20/2013  DATE OF SERVICE: 07/21/2013   CHIEF COMPLAINT: Weakness, slurred speech.   HISTORY OF PRESENTING ILLNESS: An 81 year old Caucasian female patient with history of congestive heart failure, bipolar, mania, CVA, who presents to the Emergency Room, brought in by the son from assisted living, with complaints of slurred speech and severe weakness, which started earlier today afternoon. The patient went out to [employer] with her son, was in a wheelchair, suddenly felt weak, onset of slurred speech, and went back to assisted living facility hoping she would feel better after she got some sleep, but was noticed to have hypotension with systolic in the 80s and brought to the Emergency Room. Here, the patient's blood pressure is still low in the 90s. Has urinary tract infection and is being admitted to the hospitalist service. She had a CT scan of the head without contrast, which shows atrophy, no acute changes.   The patient does not complain of any focal weakness. Says she has had poor appetite over the past few days. Has significant slurred speech, and as per the son she has a clear speech normally.   PAST MEDICAL HISTORY: 1.  Hypothyroidism.  2.  Hyponatremia.  3.  Diastolic congestive heart failure.  4.  Osteoarthritis.  5.  Cellulitis.  6.  Pneumonia.  7.  CVA.  8.  Hypercholesterolemia.  9.   Osteoporosis.  10.  Gastritis.  11.  Diabetes mellitus type 2.  12.  Bipolar disorder.    PAST SURGICAL HISTORY:  1.  D and C.  2.  Cholecystectomy. 3.  Cataract extraction.   ALLERGIES: RISPERDAL, CRESTOR, CIPROFLOXACIN.  FAMILY HISTORY: Colon cancer.   SOCIAL HISTORY: The patient lives in [facility] Her son is her power of attorney. She does not smoke. No alcohol. Ambulates with a walker normally.   REVIEW OF SYSTEMS: CONSTITUTIONAL: Complains of fatigue, poor appetite.  EYES: No  blurred vision, pain or redness.  ENT: No tinnitus, ear pain, hearing loss.  RESPIRATORY: No cough, wheezing, or hemoptysis present.  CARDIOVASCULAR: Chest pain, orthopnea. Has congestive heart failure.  GASTROINTESTINAL: No nausea, vomiting, diarrhea, abdominal pain.  GENITOURINARY: Has a urinary tract infection.  ENDOCRINE: No nocturia, thyroid problems.  HEMATOLOGIC AND LYMPHATIC:  No anemia, easy bruising, bleeding.  INTEGUMENTARY: No acne, rash, lesions.  MUSCULOSKELETAL: Has some arthritis.  NEUROLOGIC: Had slurred speech. No focal weakness.  PSYCHIATRIC: Has bipolar disorder. Multiple psychiatric admissions in the past.   HOME MEDICATIONS: Home medication list is presently not available; reviewed medications from prior discharge summary.   PHYSICAL EXAMINATION: VITAL SIGNS: Temperature 97.4, pulse of 69, blood pressure of 96/54, saturating 95% on room air.  GENERAL: Elderly Caucasian female patient, sitting up in bed with significant slurred speech, but seems coherent, conversational.  PSYCHIATRIC: Alert and awake, oriented to place and person but not to time.  HEENT: Atraumatic, normocephalic. Oral mucosa dry and pink. No oral ulcers or thrush. External ears and nose normal. Pallor positive.  NECK: Supple. No thyromegaly or palpable lymph nodes. Trachea midline. No carotid bruit, JVD.  CARDIOVASCULAR: S1, S2, without any murmurs. Peripheral pulses 2+. No edema.  RESPIRATORY:  Normal work of breathing. Clear to auscultation both sides. GASTROINTESTINAL:  Soft abdomen, nontender. Bowel sounds present. No organomegaly palpable.  GENITOURINARY: No CVA tenderness or bladder distention.  SKIN: Warm and dry. No petechiae or rash. MUSCULOSKELETAL: No joint swelling, redness, effusion of the large  joints. Normal muscle tone.  NEUROLOGICAL: Motor strength 4+/5 in upper and lower extremities. Significant slurred speech. Cranial nerves II through XII are grossly intact. Reflexes 2+.   LYMPHATIC: No cervical lymphadenopathy.   LABORATORY STUDIES: Show glucose 106, BUN 19, creatinine 1.44, sodium 135, potassium 3.3, with GFR of 33. AST, ALT, alkaline phosphatase, bilirubin normal. Troponin less than 0.02. Valproic acid level was 8. WBC 4.3, hemoglobin 10.8, platelets of 144. Urinalysis shows 3+ bacteria and 137 WBCs.   CT scan of the head shows no acute abnormalities other than some atrophy.   Chest x-ray, PA and lateral, shows mild right basilar atelectasis. No acute changes.   EKG shows normal sinus rhythm, no acute changes.   ASSESSMENT AND PLAN: 1.  Urinary tract infection with acute encephalopathy. The patient does have hypotension in the 90s, was lower at 80s in the assisted living facility. Will start on IV fluids, monitor closely for any worsening. We will send for urine and blood cultures. Start on ceftriaxone for the urinary tract infection. She does not have any immunosuppression or chronic Foley.  2.  Slurred speech seems to be acute onset. Although urinary tract infection could cause this, she has history of cerebrovascular accident. CT scan does not show anything acute. We will check an MRI of the brain. If there is an acute stroke on MRI of the brain, she will need further work-up with carotid Dopplers and echocardiogram. Presently awaiting MRI results. Will put her on neuro checks. Hopefully, patient will improve with IV antibiotics as her urinary tract infection gets better. The patient will be on a tele floor, with neuro checks q.4 hours and fall precautions.  3.  Chronic diastolic congestive heart failure, stable. Monitor for fluid overload, as she will be on IV fluids.  4.  Chronic kidney disease, stage 3.  Patient's baseline creatinine seems be around 1.45, stable.  5.  Hypokalemia. Replace orally.  6.  Anemia of chronic disease, stable.  7.  Bipolar disorder. Will await her complete medication list and restart them when available. 8.  Deep venous thrombosis  prophylaxis with heparin.   CODE STATUS: Full code.   Time spent today on this case was 50 minutes.    ____________________________ Molinda BailiffSrikar R. Dragan Tamburrino, MD srs:cg D: 07/21/2013 00:52:58 ET T: 07/21/2013 01:26:28 ET JOB#: [id number]  cc: Wardell HeathSrikar R. Esley Brooking, MD, <Dictator> Teena Iraniavid M. Terance HartBronstein, MD Orie FishermanSRIKAR R Astaria Nanez MD ELECTRONICALLY SIGNED 07/21/2013 1:45

## 2014-12-11 NOTE — Consult Note (Signed)
Brief Consult Note: Diagnosis: Bipolar disorder in remission.   Patient was seen by consultant.   Discussed with Attending MD.   Comments: The patient has a h/o bipolar disorder. [facility] for man[facility]. She was discharged on a combination of Depakote and Geodon. Compliance was a problem then as im Geodone was prescribed if the patient refused oral. The patient is currently on a combination of Latuda, Seroquel and Depakote. Gaynelle AduSher has not been compliant with Depakote as indicated by low VPA level.   The patient was unwilling to talk to me "go away". She does not appear to be manic. There is a long h/o treatment noncompliance. I do not bel;ieve this can be easily changed.   She does not require psychiatric hospitalization at present. If needed, she should be referred to Geropsychiatry Unit. [place] one [place] in [place].  Electronic Signatures: Kristine LineaPucilowska, Jayvon Mounger (MD)  (Signed 01-Dec-14 15:07)  Authored: Brief Consult Note   Last Updated: 01-Dec-14 15:07 by Kristine LineaPucilowska, Kiauna Zywicki (MD)

## 2014-12-12 NOTE — Consult Note (Signed)
   Present Illness Asked to see pt who is 81 yo female with history of recent unwitness fall and was noted to have evidence of rhabdomyolysis with mild elevated serum troponin at 0.3. Her cpk/mb is also elevated. 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 Pt was somewhat agitated and beligerant when I attempted to see her and demanded i leave the room. Exam not able to be comleted due to patient lack of cooperation and combativenes.   Physical Exam:  GEN disheveled   PSYCH poor insight, agitated   Review of Systems:  Subjective/Chief Complaint deneis complaints and demanded i leave the room   Medications/Allergies Reviewed Medications/Allergies reviewed   EKG:  EKG NSR   Interpretation nssttw changes    Risperdal: Rash  Crestor: Unknown  Cipro: Unknown  Other- Explain in Comments Line: Unknown   Impression 81 yo female with history of phsyciatric illness including bipolar disorder and schizophrenia who was admitted after suffering an unwitnessed fall.. Her serum troponin is elevated likley secondary to rhabdomyolysis. Not able to examine the patient due to combativeness and her asking me to leave the room. Does not appear to have had an acute coronary event. Troponin likely secondary to rhabdo. Not candidate for invasive cardiac evaluation   Plan 1. Gentle hydrateion 2. Not candidate for invasive evauation 3. Does not apepar to have had an acute coronary event. Would treate for rhabdo and ambulate as tolerated. 4. WIll follow up if patient allows.   Electronic Signatures: Dalia HeadingFath, Nirvaan Frett A (MD)  (Signed 13-Sep-15 11:38)  Authored: General Aspect/Present Illness, History and Physical Exam, Review of System, EKG , Allergies, Impression/Plan   Last Updated: 13-Sep-15 11:38 by Dalia HeadingFath, Maelys Kinnick A (MD)

## 2014-12-12 NOTE — Consult Note (Signed)
Brief Consult Note: Diagnosis: bipolar disorder.   Patient was seen by consultant.   Consult note dictated.   Recommend further assessment or treatment.   Orders entered.   Comments: PSychiatry: Patient seen and chart reviewed and note dictated. Hx bipolar disorder. May be depressed. Hard to assess. Uncooperative. Changed some med orders. Ordered some labs. Will follow.  Electronic Signatures: Wilmore Holsomback, Jackquline DenmarkJohn T (MD)  (Signed 16-Sep-15 15:04)  Authored: Brief Consult Note   Last Updated: 16-Sep-15 15:04 by Audery Amellapacs, Lyne Khurana T (MD)

## 2014-12-12 NOTE — Consult Note (Signed)
Brief Consult Note: Diagnosis: Left displaced femoral neck hip fracture.   Patient was seen by consultant.   Consult note dictated.   Recommend to proceed with surgery or procedure.   Orders entered.   Comments: I am recommending a left hip hemiarthroplasty for this patient with a displaced femoral neck hip fracture.  I have reviewed all radiographs and labs in preparation for this case.  She is cleared for surgery by the hospitalist.  Surgical site signed as per "right site surgery" protocol.  Surgery scheduled for later today.  Electronic Signatures: Juanell FairlyKrasinski, Jamorian Dimaria (MD)  (Signed 27-Oct-15 09:08)  Authored: Brief Consult Note   Last Updated: 27-Oct-15 09:08 by Juanell FairlyKrasinski, Shakeel Disney (MD)

## 2014-12-12 NOTE — H&P (Signed)
PATIENT NAME:  Leslie Savage, SMART MR#:  [id number] DATE OF BIRTH:  01/11/1928  DATE OF ADMISSION:  05/03/2014  REFERRING PHYSICIAN:  Hinda Kehr  PRIMARY CARE PHYSICIAN:  Youlanda Roys. Lovie Macadamia, MD.  CHIEF COMPLAINT:  Found on the floor.   HISTORY OF PRESENT ILLNESS: This is an 81 year old female with known history of congestive heart failure, bipolar disorder and mania, CVA, dementia. The patient presents, as she was found on the floor on the nursing home for an unknown period of time. The patient was found unconscious. The patient is a very poor historian and cannot give any reliable history, son present at the bedside.  As well, he is not aware for how long the patient has been on the floor. The patient had CT head and CT cervical spine without any acute findings. The patient had basic work-up which did show her to have leukocytosis and UTI as well as worsening renal function with baseline creatinine 1.4,  today it was 1.77.  The patient is noncompliant with the history and physical exam, but as per her son his mother did deny having any chest pain, any shortness of breath, any ache or any pain anywhere. The patient has multiple bruises, the son reports she had a fall before, 2 weeks ago, with significant head bruising and left knee bruising, as well. The patient had some laceration of the wrists and bruising where it was bandaged by ED.  The patient as well was noted to have elevated troponin at 0.31 as well as elevated total CK at 730. She was given 324 mg of aspirin in the ED, her EKG showing accelerated junctional rhythm.   PAST MEDICAL HISTORY: Hypothyroidism, hyponatremia, diastolic CHF, osteoarthritis, cellulitis, pneumonia, CVA, hypercholesterolemia, osteoporosis, gastritis, diabetes mellitus, bipolar disorder.   PAST SURGICAL HISTORY:  D and C, cholecystectomy, cataract extraction.   ALLERGIES: RISPERDAL, CRESTOR, CIPROFLOXACIN.   FAMILY HISTORY:  Colon cancer.   SOCIAL HISTORY: Lives in a  nursing home. Her son is her healthcare power of attorney and she has a living will.  She is a full code. No smoking. No alcohol. As well, she ambulates at baseline with a walker.   REVIEW OF SYSTEMS: The patient was noncompliant with review of systems and refused to answer my questions.   HOME MEDICATIONS:  1.  Lisinopril 20 mg oral daily. 2.  Depakote 125 mg oral 2 times a day.  3.  Cymbalta 50 mg oral daily.  4.  Actoplus MET 500/50 oral daily.  5.  Claritin 10 mg daily.  6.  Fluphenazine 2.5 mg oral daily.  7.  Fluphenazine injection 37.5 mg every other week.  8.  Latuda 20 mg oral daily.  9.  Coreg 12.5 mg oral b.i.d.  10. Artificial tears at bedtime.  11. Magnesium oxide 800 mg oral daily.  12. Synthroid 300 mg oral daily.  13. Vitamin D3 fifty thousand International Units 1 capsule monthly.   PHYSICAL EXAMINATION:  VITAL SIGNS: Temperature 98, pulse 106, respiratory rate 19, blood pressure 181/70, saturating 100% on room air.  GENERAL: Frail, elderly female lying comfortably, in no apparent distress.  HEENT:  Head has bruising and ecchymosis on the left side of the face which is old from the previous fall. Pink conjunctivae. Anicteric sclerae. Dry oral mucosa. No nasal discharge.  NECK: Supple. No thyromegaly. No JVD. No carotid bruits. Trachea is midline.  CHEST: Good air entry bilaterally. No wheezing, rales, rhonchi or use of accessory muscles.  CARDIOVASCULAR: S1, S2 heard. No rubs,  murmurs, gallops. PMI not displaced. Tachycardic, but regular.  ABDOMEN: Soft, nontender, nondistended. Bowel sounds present.  EXTREMITIES: No edema. No clubbing. No cyanosis. Pedal pulses +2 bilaterally.  PSYCHIATRIC: The patient is noncompliant with answering my questions, but she is awake conversant. NEUROLOGIC: Unable to evaluate appropriately as she is refusing to comply with my instructions. SKIN: Normal skin turgor. Warm and dry.  MUSCULOSKELETAL: Does have multiple bruises around the  wrist areas and bilateral knees in the face.  LYMPHATIC: No cervical or supraclavicular lymphadenopathy.   PERTINENT LABORATORY DATA: Glucose 353, BUN 32, creatinine 1.77, sodium 132, potassium 3.7, chloride 96, CO2 26, troponin 0.31, CK-MB 17.7, total CK 730, white blood cells 14, hemoglobin 15.3, hematocrit 30.6, platelets 189. INR is 1.1.   Urinalysis showing +3 leukocyte esterase, positive nitrites, 246 white blood cells.   IMAGING STUDIES:  CT head and CT cervical spine showing no acute finding. No evidence of traumatic intracranial injury or fracture, no evidence of fracture or subluxation along the cervical spine.   ASSESSMENT AND PLAN:  1.  Acute on chronic renal failure. The patient has worsening of creatinine from her baseline, this is most likely due to volume depletion and rhabdomyolysis. We will continue with aggressive IV fluids. We will monitor closely.  2.  Rhabdomyolysis. The patient has elevated total CK and worsening renal failure and she had been on the floor for an unknown period of time. We will avoid nephrotoxic medication. We will continue with aggressive IV fluid hydration.  3.  Elevated troponins in the setting of acute renal failure and rhabdomyolysis. This is most likely related to patient's rhabdomyolysis and acute renal failure, but as well, her total CK, CK-MB is slightly elevated, so the patient was given 324 mg of aspirin and will be admitted to the telemetry unit. We will continue to cycle her cardiac enzymes and follow the trend. We will continue with beta blockers. We will hold ACE inhibitor secondary to her renal failure. We will consult cardiology for further evaluation.  4.  Sepsis. The patient is tachycardic with leukocytosis secondary to urinary tract infection. She will be started on IV Rocephin.  5.  Syncope. This is most likely related to patient's urinary tract infection. We will continue with hydration.  6.  Acute encephalopathy in the setting of baseline  dementia. This is most likely related to her urinary tract infection. CT head did not show any acute findings.  7.  Hypothyroidism. Continue with Synthroid.  8.  Diastolic congestive heart failure. The patient does not appear to be in any volume overload. We will monitor closely.  9.  Diabetes mellitus. We will add insulin sliding scale. We will hold her oral hypoglycemic agents until her renal function improves.  10. DVT prophylaxis, subcutaneous heparin.   CODE STATUS: The patient is full code. Her son is her healthcare power of attorney. She has a living will.   Total time spent on admission and patient care: 55 minutes.    ____________________________ Albertine Patricia, MD dse:lt D: 05/03/2014 06:22:11 ET T: 05/03/2014 07:04:44 ET JOB#: [id number]  cc: Albertine Patricia, MD, <Dictator> DAWOOD Graciela Husbands MD ELECTRONICALLY SIGNED 05/04/2014 3:56

## 2014-12-12 NOTE — Discharge Summary (Signed)
PATIENT NAME:  Leslie Savage, Leslie Savage MR#:  [id number] DATE OF BIRTH:  1928/04/30  DATE OF ADMISSION:  06/15/2014 DATE OF DISCHARGE:    ADMITTING DIAGNOSES: Left hip fracture, nondisplaced; urinary tract infection.  DISCHARGE DIAGNOSES: Left hip fracture, nondisplaced, postoperative day #3, continue dressing care and physical therapy as tolerated; acute cystitis; urine preliminary culture reveals yeast. The patient is on Diflucan. Final urine culture report is pending at this time. Nursing home physician is to follow up on that.   SECONDARY DISCHARGE DIAGNOSES: Type 2 diabetes mellitus, hypertension, hypothyroidism.   CONSULTATIONS: Orthopedics,   PROCEDURES: Left hip surgery.   HISTORY OF PRESENT ILLNESS: The patient is an 81 year old Caucasian female with chronic history of dementia who resides at [facility]. Was brought into the ED after she sustained a fall. The patient had a mechanical fall, following which she started having severe left leg pain. The patient is brought into the ED. X-ray revealed a left femoral neck fracture. The patient was admitted to the hospital. Pain management was provided. Orthopedics was consulted and the patient was admitted to the hospital.   HOSPITAL COURSE: The patient was evaluated by the hospitalist team and cleared for surgery. The patient had left hip surgery done on October 27 and pain management was continued. As the patient is demented, she was not cooperative with physical therapy. Pain is tolerable with Tylenol.   Acute cystitis: Urinalysis was done in the ED but urine culture was not done. Urine culture was originally done on October 29, which revealed yeast and the final report is still pending at this time. The plan is to continue fluconazole and [name] Bactrim. As per my discussion with the case manager, as the patient is going to [facility], the physician at the nursing home can follow up on the final urine culture and make necessary changes in the  antibiotics. Given the history of ESBL in the past, Iwas waithing for the urine culture final report but  I was told by the case manager that the patient can be provided with IV antibiotcs based on final urine culture resuts at  the [facility] if needed. Having said that, we are planning to transfer the patient back to [facility] for continuation of physical therapy.   For diabetes mellitus, patient will be continued on her home medication.   Hypothyroidism and hypertension: Continue Synthroid and lisinopril.   The patient, being demented, she was not quite cooperative with physical therapy regarding rehabilitation.   Hospital course was uneventful.   CONDITION AT THE TIME OF TRANSFER: Stable.   ACTIVITY: As tolerated, as recommended by physical therapy.  DRESSING CARE: By orthopedics.   LABORATORY AND IMAGING STUDIES: Urine culture performed on October 29 revealed 50,000 colonies of yeast. ID to follow. Urinalysis on October 29 revealed 3+ leukocyte esterase, 197 WBCs, WBC clumps are present, budding yeast is present, hyphae are present. Last few Accu-Cheks: 204, 150, 117. BMP is normal.  MEDICATIONS AT THE TIME OF DISCHARGE: Cymbalta 50 mg [name] once daily, artificial tears apply 1 to 2 drops each eye once a day at bedtime, Depakote 125 mg delayed release 2 times a day, magnesium oxide 400 mg 2 times a day,  Claritin 10 mg 1 tablet [name] once daily, lisinopril 20 mg [name] once daily, Coreg 12.5 mg [name] b.i.d., Synthroid 300 mcg [name] once daily, fluphenazine 2.5 mg 1 tablet [name] once daily, Tylenol 325 mg 2 tablets every 4 hours as needed for pain and temperature  greater than 100.4, Colace 100 mg 1 capsule [name] 2 times a day as needed for constipation, fluconazole 100 mg [name] once daily for 3 more days, calcium with vitamin D 1 tablet [name] 2 times a day, Bactrim DS 1 tablet [name] 2 times a day, iron sulfate 325 mg 1 tablet [name] 2 times a day, Bactrim needs to be  continued for another 5 days.   DIET: Low-sodium, carbohydrate-controlled. Consistency: Mechanical soft.   ACTIVITY: Per PT.  Follow up with primary care physician in 2 days and PCP at nursing home needs to follow up on the pending urine culture. Follow up with orthopedics as recommended in 1 to 2 weeks.   Plan of care was discussed with nursing and family members were called to notify regarding the plan of care.   TOTAL TIME SPENT ON DISCHARGE: 45 minutes   ____________________________ Ramonita Lab, MD ag:ah D: 06/19/2014 12:58:45 ET T: 06/19/2014 13:22:34 ET JOB#: [id number]  cc: Ramonita Lab, MD, <Dictator> Ramonita Lab MD ELECTRONICALLY SIGNED 06/29/2014 21:55

## 2014-12-12 NOTE — Discharge Summary (Signed)
PATIENT NAME:  Leslie Savage, Leslie Savage MR#:  [id number] DATE OF BIRTH:  23-Sep-1927  DATE OF ADMISSION:  05/03/2014 DATE OF DISCHARGE:  05/07/2014  ADMITTING DIAGNOSIS:  Syncope with collapse.   DISCHARGE DIAGNOSIS:  1.  Syncope with collapse due to acute on chronic renal failure as well as rhabdomyolysis.  2.  Rhabdomyolysis due to a fall, now CPK is close to normal.  3.  Elevated troponin in the setting of acute failure and rhabdomyolysis, seen by cardiology, medical management recommended.  4.  Possible sepsis on presentation due to urinary tract infection.  5.  Acute encephalopathy in the setting of acute renal failure and a urinary tract infection.  6.  Hypothyroidism.  7.  History of diastolic congestive heart failure, currently compensated.  8.  Diabetes.  9.  History of bipolar disorder and schizophrenia with some paranoia during hospitalization; seen by psychiatry consult, Dr. Toni Amendlapacs, as well as Dr. Harold HedgeKenneth Fath.   PERTINENT LABORATORY STUDIES AND EVALUATIONS:  Admitting glucose 353, BUN 32, creatinine 1.77, sodium 132, potassium 3.7, chloride 96, CO2 of 26, calcium was 9.7. LFTs were normal except albumin at 3.0, troponin 0.31, 0.30, and 0.35. WBC was 14.0, hemoglobin 13.2, platelet count 189.   Urinalysis:  3+ leukocytes, WBCs 246,000.   HOSPITAL COURSE: Please refer to H and Savage done by the admitting physician. The patient is an 81 year old white female with history of diastolic CHF, bipolar disorder and mania, CVA, dementia, who was found on the floor in a nursing home for an unknown period of time. The patient was found unconscious; by the time she arrived in the ED she was awake but not able to give any history. CT scan of the head was negative. The patient was noted to have a urinary tract infection as well as acute renal failure. She was admitted for IV hydration, IV antibiotics. In terms of her mental status she is back to baseline. She is refusing some medications and things like that  but that is apparently something common that she has done.  She has been seen by psychiatry for that; her medication has been adjusted. The patient also was noted to have elevated troponin and was seen by cardiology who recommended medical management, they felt this was due to demand ischemia. Felt that she was not a candidate for any invasive evaluation. The patient's renal function has now normalized with IV fluids. She is doing much better and is stable for discharge. Discharge instructions for CHF given.   DISCHARGE MEDICATIONS: Cymbalta 50 mg daily, artificial tears 1 application to affected eye at bedtime, Depakote 125 mg 1 tablet [name] b.i.d., magnesium oxide 400 mg 1 tablet [name] b.i.d., Actos plus metformin 500/50 one tablet daily, Claritin 10 mg daily, lisinopril 20 mg daily, Coreg 12.5 mg 1 tablet [name] b.i.d., Synthroid 300 mcg daily, fluphenazine 2.5 mg daily, fluphenazine 37.5 mg injectable every other week, Tylenol 650 mg q. 4 hours Savage.r.n. for pain, Colace 100 mg 1 tablet [name] b.i.d. as needed, Ceftin 500 mg 1 tablet [name] b.i.d. x 4 days.   DIET: Low-sodium, low-fat, low-cholesterol.   ACTIVITY: As tolerated. PT evaluation and treatment. Follow with primary care provider in 1-2 weeks. Follow with Dr. Harold HedgeKenneth Fath in 2-4 weeks.   Time spent:  40 minutes on the discharge.    ____________________________ Lacie ScottsShreyang H. Allena KatzPatel, MD shp:lt D: 05/07/2014 11:05:36 ET T: 05/07/2014 11:24:30 ET JOB#: [id number]  cc: Arihanna Estabrook H. Allena KatzPatel, MD, <Dictator> Charise CarwinSHREYANG H Rosario Duey MD ELECTRONICALLY SIGNED 05/08/2014 8:43

## 2014-12-12 NOTE — Op Note (Signed)
PATIENT NAME:  Leslie Savage, Leslie Savage MR#:  [id number] DATE OF BIRTH:  September 22, 1927  DATE OF PROCEDURE:  06/16/2014  PREOPERATIVE DIAGNOSES: Left femoral neck hip fracture.   POSTOPERATIVE DIAGNOSIS: Left femoral neck hip fracture.   OPERATION: Left hip hemiarthroplasty.   SURGEON: Kathreen Devoid, MD   ANESTHESIA: Spinal.   SPECIMEN: Left femoral head to pathology.   ESTIMATED BLOOD LOSS: 100 mL.   COMPLICATIONS: None.  IMPLANTS: Stryker Accolade TMZF size 2.5 femoral stem, Unitrax 44 mm outer diameter unipolar head with a +4 neck adjustment sleeve.   INDICATIONS FOR THE PROCEDURE: The patient is an 81 year old female who sustained a fall at her nursing facility. She was brought to the [facility] Emergency Department where she was diagnosed with a displaced femoral neck hip fracture. The patient suffers from dementia. I spoke with her son, Sundy Houchins, on the telephone. He is currently on a business trip in [place]. I had recommended to him a hemiarthroplasty procedure to treat the patient's fracture for pain control and to hopefully allow her to return to ambulation. I reviewed the risks and benefits of this procedure with Mr. Stratton and he agreed to the procedure. He had given informed consent to the nurses on the orthopedic floor in an earlier conversation.   PROCEDURE NOTE: I marked the patient's left hip with my initials and the word "yes" according to the hospital's right site protocol. The patient was then brought to the operating room, where she underwent a spinal anesthetic. She was then placed in a right lateral decubitus position. All bony prominences were adequately padded, including an axillary roll under her right chest, and additional padding was placed around the common peroneal nerve of the right leg to avoid compression during the case. The patient was positioned using a pegboard. She was prepped and draped in a sterile fashion. A timeout was performed to verify the patient's  name, date of birth, medical record number, correct site of surgery and correct procedure to be performed. It was also used to confirm that the patient had received antibiotics and that all appropriate instruments, implants, and radiographic studies were available in the room. Once all in attendance were in agreement, the case began.   The patient had bony landmarks drawn out with a surgical marker. A proposed curvilinear incision was drawn out, also, with a surgical marker. The initial incision was made using a #10 blade centered over the greater trochanter. The subcutaneous tissues were then dissected using electrocautery. All bleeding vessels were cauterized during the exposure. The fascia lata was then identified and cleared of overlying adipose tissue using a Art therapist. The fascia lata was then sharply incised with a #10 blade in a curvilinear fashion. The gluteus maximus muscle was then split in line with its fibers to reveal the underlying hip bursa. This was resected along with the external rotators from the posterior aspect of the greater trochanter. The piriformis was tagged with a #2 Ti-Cron and all the external rotators were reflected posteriorly to protect the sciatic nerve. A Charnley hip retractor was placed to allow for adequate visualization. The hip capsule was then identified. A T-shaped capsulotomy was then performed using electrocautery. Both leaflets of the capsule were tagged for later repair with a #2 Ti-Cron.  The fracture was then identified. The femoral head was removed using a corkscrew device and measured to be 44 mm in diameter. An oscillating saw was then used to make a proximal femoral osteotomy. Two cobra retractors were then placed  around the acetabulum. The 44 mm femoral head trial was then inserted in the acetabulum and found to have excellent fit. The soft tissue within the pulvinar was removed using electrocautery. The attention was then turned back to femoral  preparation.   A femoral hip skid was placed under the femur and the hip was internally rotated. A box osteotome was used to make the initial entry into the proximal femur. A single hand reamer was used to enter the femoral canal. A femoral canal sounder was then used to ensure that no cortical penetration had occurred during hand reaming. Sequential femoral broaches were then inserted in the proximal femur until the best fit was achieved with a size 2.5. A trial neck and head construct was then placed onto this femoral trial. The hip was then reduced. It was taken through a full range of motion. The 44 mm outer head with a +4 neck adjustment sleeve had the best soft tissue tension and stability. The leg lengths were also equivalent. The trial components were then removed. The hip was copiously irrigated with pulse lavage using GU-impregnated saline. The actual Stryker Accolade TMZF size 2.5 femoral stem was then gently malleted into position. Again, the trial 44 mm head with a +4 neck adjustment sleeve was placed onto the trunnion and taken through a range of motion. The hip again had excellent stability and equivalent leg lengths. The trial head was then removed and the actual Stryker Unitrax 44 mm outer diameter unipolar head with a +4 neck adjustment sleeve was then malleted onto the trunnion. The construct was reduced. The hip joint again was copiously irrigated with pulse lavage. The capsulotomy was repaired using #2 Ti-Cron. The piriformis tendon could not be repaired without undue tension, and therefore the Ti-Cron suture that had been used to tag the piriformis was cut out. The fascia lata was closed with an interrupted 0 Vicryl suture. The subcutaneous tissue was closed in 2 layers using 2-0 Vicryl and the skin approximated with staples. The patient was then rolled onto her back on the operative table. Her leg lengths were equivalent. A hip abduction pillow was placed around her lower extremities, and  she was transferred to a hospital bed in stable condition. I was scrubbed and present for the entire case, and all sharp and instrument counts were correct at the conclusion of the case.  I called her son Leslie HuaDavid in [place] following the conclusion of the case to let him know that his mother had done well with surgery and was stable in the recovery room.   ____________________________ Kathreen DevoidKevin L. Beverly Ferner, MD klk:ST D: 06/19/2014 23:47:00 ET T: 06/20/2014 00:55:56 ET JOB#: [id number]  cc: Kathreen DevoidKevin L. Kymber Kosar, MD, <Dictator> Kathreen DevoidKEVIN L Darris Carachure MD ELECTRONICALLY SIGNED 07/08/2014 8:06

## 2014-12-12 NOTE — Consult Note (Signed)
PATIENT NAME:  Leslie Savage, [name] MR#:  [id number] DATE OF BIRTH:  [birth year]  DATE OF CONSULTATION:  05/06/2014  REFERRING PHYSICIAN:   CONSULTING PHYSICIAN:  Audery AmelJohn T. Clapacs, MD  IDENTIFYING INFORMATION AND REASON FOR CONSULT: An 81 year old woman with a history of bipolar disorder,  currently in the hospital for rhabdomyolysis related to multiple falls. Consultation for evaluation of paranoia and psychosis.   HISTORY OF PRESENT ILLNESS: Information obtained from the patient and the chart. The patient clearly has a history of what sounds like bipolar disorder. It is not reported on admission that she had specific psychiatric symptoms on admission. The team seems to be presuming that she is paranoid because last night she refused medications. This morning, it looks like she took her oral medications.   I attempted to interview the patient today, but she was extremely uncooperative. She told me "go away" a couple of times, and then closed her eyes and told me she was going to sleep and would not talk to me anymore. She would not describe to me what her mood was. Would not answer questions about hallucinations. Did not say anything to indicate any specific paranoia. She was oriented to being in the hospital, and that she was here because of a fall. Really would not give me much other information. The patient was brought into the hospital after being found unconscious on the floor. She has multiple medical problems. I do not see any indication of any recent definite noncompliance with medication at home.   The patient is currently on psychiatric medicines as follows: Depakote Sprinkles 125 mg twice a day, Cymbalta 40 mg per day, fluphenazine 2.5 mg per day, fluphenazine long-acting injectable long-acting 37.5 mg every other week, Latuda 20 mg per day.   PAST PSYCHIATRIC HISTORY: The most clear-cut piece of  past psychiatric history was an admission she had to the hospital here in 2010, in which a manic state  was clearly described. History at that time strongly supported a history of bipolar disorder with both depression and mania in the past. The patient has had previous psychiatric hospitalizations. Unclear who was prescribing her psychiatric medicine currently, although I suspect it is probably her primary care doctor.   SOCIAL HISTORY: Currently lives at [facility]. She was described, when evaluated by social work the other day, as actually being pretty clear in her interactions. Son is her health care power of attorney.   PAST MEDICAL HISTORY: Multiple medical problems. Chronic high blood pressure hypothyroidism, diastolic heart failure, diabetes, current possible sepsis and urinary tract infection with rhabdomyolysis.   FAMILY HISTORY: Unknown.   SUBSTANCE ABUSE HISTORY: Does not appear to be an active issue. I see some old records that suggested that, at some time in the past, it may have been involved, but it is not a problem currently.   REVIEW OF SYSTEMS: The patient really would not engage in enough of an interview with me to give me an appropriate review of systems except to tell me that she was tired. Otherwise, would not answer questions, despite review of systems being attempted.   MENTAL STATUS EXAMINATION: Elderly, chronically ill woman, somewhat disheveled. She was awake when I came into the room and appeared to be alert, but after a couple of minutes of my talking with her, she closed her eyes and announced she was tired and going to sleep. Eye contact during that time, intermittent. Psychomotor activity, slow. Speech, slow and decreased in amount. Affect, flat at baseline but with  irritability intermittently. Mood stated as being tired. Thoughts impossible to evaluate. Did not make any specifically paranoid statements. Did say that she did not want to talk to me. I note that she has taken that tone with Dr. Kristine Linea in the past as well. The patient was oriented to being in  the hospital and was able to tell me that she was here because she had had recent falls. Would not participate with any other cognitive testing.   LABORATORY RESULTS: No valproic acid level was done on admission. Also, I do not see that liver function tests had been done yet. Had an elevated white count on admission of 14. Urinalysis indicating a urinary tract infection. Head CT did not show any acute problems. Magnesium is still running a little low.   ASSESSMENT: This is an 81 year old woman with bipolar disorder. Currently, her presentation seems pretty similar to what she has been like when she was evaluated in the hospital before, and it is possible that this may be more or less her baseline. She has a long history of noncompliance with medication, so one evening of refusing medicine as she did last night, would just be part of her normal behavior. I was not able to get a full evaluation today, but with a history of bipolar disorder, it is possible that she may be having depression. She does not appear, to what I can tell, to be manic. Review of her medications, a slightly odd combination and very different than what she was taking when she was here in the hospital a few years ago. The dose of Depakote would be expected to be very low for her unless she was metabolizing it extremely slowly. I am going to go ahead and order a Depakote level to be done, to see where she is right now. Her dose of Latuda is lower than would probably be recommended for treating mood and psychosis. I am going to increase that to 40 mg a day. She is on a combination of 2 different antipsychotics, and I am not sure why, unless it was because they just wanted to make sure that she got a Prolixin decanoate. I stopped the once a day daily Prolixin, as this is probably just contributing to her slowness and drooling that I noted; not very likely to have a direct impact on mood. The Cymbalta dose was 40 mg. Previous Cymbalta dose she had  taken was 60, and I have increased the dose, although that is does increase the risks of possible mania. Dosing of Depakote and Cymbalta may be dependent on liver function. I have gone ahead and ordered a hepatic function panel for today, as well. I will follow as needed.   DIAGNOSIS, PRINCIPAL AND PRIMARY:  AXIS I: Bipolar disorder, not otherwise specified.   SECONDARY DIAGNOSES:  AXIS I: Dementia, mixed type, Alzheimer's, and vascular.  AXIS II: Deferred.  AXIS III: Rhabdomyolysis, urinary tract infection, hypertension, multiple medical problems as identified.    ____________________________ Audery Amel, MD jtc:MT D: 05/06/2014 15:15:32 ET T: 05/06/2014 15:52:10 ET JOB#: [id number]  cc: Audery Amel, MD, <Dictator> Audery Amel MD ELECTRONICALLY SIGNED 05/14/2014 0:09

## 2014-12-12 NOTE — Consult Note (Signed)
PATIENT NAME:  Leslie Savage, [name] MR#:  [id number] DATE OF BIRTH:  Sep 24, 1927  DATE OF CONSULTATION:  06/16/2014  REFERRING PHYSICIAN:   CONSULTING PHYSICIAN:  Kathreen DevoidKevin L. Tariah Transue, MD  REASON FOR CONSULTATION: Left femoral neck hip fracture.   HISTORY OF PRESENT ILLNESS: Ms. 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 The patient does ambulate at baseline. After falling she was unable to stand due to pain in the left leg. Upon presentation to [facility], she was found to have a displaced left femoral neck hip fracture.   PAST MEDICAL HISTORY: Includes hypothyroidism, congestive heart failure (diastolic), history of CVA with no residual neurologic deficits, hyperlipidemia, type 2 diabetes and bipolar disorder.   SOCIAL HISTORY: The patient does not smoke, use alcohol or take drugs. She is a baseline ambulator with a walker.   ALLERGIES: CIPRO, CRESTOR AND RISPERDAL.  HOME MEDICATIONS: Include acetaminophen 325 mg 2 tablets p.o. q. 4 hours p.r.n., lisinopril 20 mg p.o. daily, Depakote 125 mg p.o. b.i.d., Cymbalta 50 mg p.o. daily, ACTOplus/metformin 500/15 mg p.o. daily, Claritin 10 mg p.o. daily, fluphenazine 2.5 mg p.o. daily, fluphenazine 37.5 mg injection every other week, Coreg 12.5 mg p.o. b.i.d., Colace 100 mg p.o. b.i.d. as needed for constipation, magnesium oxide 400 mg p.o. b.i.d., Artificial Tears 1 drop to each effected eye at bedtime, Synthroid 300 mcg p.o. daily.   PHYSICAL EXAMINATION: Left hip: The patient's skin is intact. There is no erythema, ecchymosis or swelling. Her thigh and leg compartments are soft and compressible. She is in Buck's traction. She has palpable pedal pulses. She can flex and extend her toes and has  intact sensation to light touch throughout the left lower extremity.   RADIOLOGY: X-ray views of the pelvis and left hip were reviewed today. This shows a displaced femoral neck hip fracture.   ASSESSMENT: Displaced left femoral neck hip fracture.   PLAN: I am recommending that [name] undergo a left hip hemiarthroplasty. Given the patient's dementia, I spoke with the patient's son, Leslie Savage. He is on a business trip in [place] and I spoke to him by phone. I explained the proposed procedure as well as the risks. He had already given consent to the nurse by phone for surgery. I have marked the patient's left leg with the word "yes" according to the hospital's right site protocol. The patient has been n.p.o. after midnight. She is cleared for surgery by the hospitalist service. She has been placed on ceftriaxone for a urinary tract infection. Surgery is scheduled for later this morning.  ____________________________ Kathreen DevoidKevin L. Saina Waage, MD klk:sb D: 06/16/2014 09:04:22 ET T: 06/16/2014 09:30:02 ET JOB#: [id number]  cc: Kathreen DevoidKevin L. Edword Cu, MD, <Dictator> Kathreen DevoidKEVIN L Aireanna Luellen MD ELECTRONICALLY SIGNED 06/17/2014 15:32

## 2014-12-12 NOTE — H&P (Signed)
PATIENT NAME:  Leslie Savage, Leslie Savage MR#:  [id number] DATE OF BIRTH:  08-01-1928  DATE OF ADMISSION:  06/15/2014  REFERRING PHYSICIAN:  Dorothea GlassmanPaul Malinda, MD  PRIMARY CARE PHYSICIAN: Teena IraniDavid M. Terance HartBronstein, MD  CHIEF COMPLAINT: Fall.   HISTORY OF PRESENT ILLNESS: An 81 year old Caucasian female with a history of dementia, diastolic congestive heart failure, CVA without residual neurologic deficit, hyperlipidemia, type 2 diabetes non-insulin-requiring, uncomplicated, presenting after a mechanical fall. The patient resides at [facility]. Per documentation, she requires a walker for ambulation. However, she denies this fact. She was attempting to ambulate to the door. She fell. Suffered a mechanical fall. Noticed immediate pain in the left leg. Brought to the hospital for further work-up and evaluation. Found to have a left hip fracture. The patient herself is unable to provide much meaningful information given mental status at baseline.  REVIEW OF SYSTEMS: Unobtainable given patient's mental status.   PAST MEDICAL HISTORY: Hypothyroidism, congestive heart failure diastolic, history of CVA without residual neurological deficits, hyperlipidemia, type 2 diabetes, non-insulin-requiring, bipolar disorder not otherwise specified.   SOCIAL HISTORY: No alcohol, tobacco, or drug use documented. Is supposed to be using a walker for ambulation per documentation.   FAMILY HISTORY: Positive for colon cancer; however, no cardiovascular or pulmonary disorders.   ALLERGIES: CIPRO, CRESTOR , RISPERDAL.  HOME MEDICATIONS:  Acetaminophen 325 mg 2 tablets [name] q.4 h. as needed for pain or fever, lisinopril 20 mg [name] daily, Depakote 125 mg [name] b.i.d., Cymbalta 50 mg [name] daily, Actoplus, metformin 500/15 mg [name] daily, Claritin 10 mg [name] daily, fluphenazine 2.5 mg [name] daily, fluphenazine 37.5 mg injection every other week, Coreg 12.5 mg [name] b.i.d., Colace 100 mg [name] b.i.d. as needed for constipation, magnesium oxide 400 mg  [name] b.i.d., artificial tears 1 to each affected eye at bedtime, Synthroid 300 mcg [name] daily.   PHYSICAL EXAMINATION:  VITAL SIGNS: Temperature 98.3, heart rate 89, respirations 20, blood pressure 186/143, currently 167/80, saturating 97% on room air.  GENERAL: Frail-appearing Caucasian female, currently in no acute distress.  HEAD: Normocephalic, atraumatic.  EYES: Pupils equal, round, reactive s reactive to light. Extraocular muscles intact. No scleral icterus.  MOUTH: Moist mucosal membranes. Dentition intact. No abscess noted. EARS, NOSE, THROAT: Clear without exudates. No external lesions.  NECK: Supple. No thyromegaly. No nodules. No JVD.  PULMONARY: Clear to auscultation bilaterally without wheezes, rales, or rhonchi. Denies accessory muscle usage. Good respiratory effort. Chest nontender to palpation.  CARDIOVASCULAR: 2/6 systolic ejection murmur best heard at right upper sternal border. Pedal pulses 2+ bilaterally. No edema.  GASTROINTESTINAL: Soft, nontender, nondistended. No masses. Positive bowel sounds. No hepatosplenomegaly.  MUSCULOSKELETAL: No swelling, cyanosis, or clubbing. The left lower extremity is externally rotated. Range of motion limited in the left lower extremity secondary to fracture and pain. However, distal range of motion is intact.  NEUROLOGIC: Cranial nerves II through XII intact. No gross focal neurological deficits. Sensation intact. Reflexes intact.  SKIN: No ulcerations, lesions, rashes, or cyanosis. Skin warm, dry. Turgor intact.  PSYCHIATRIC: Mood and affect blunted. She is awake, alert, oriented to person and place. She does state hospital. She is unable to determine which hospital and she is unable to answer which year or date. Insight and judgment appear to be poor at this time.   LABORATORY DATA: X-ray of the left hip reveals mild cervical fracture of the left femoral neck with mild displacement. X-ray of the foot left side, no acute findings. Chest x-ray  performed. No acute cardiopulmonary process.  Remainder of laboratory data: EKG performed: No acute ST or T wave abnormalities. Sodium 135, potassium 3.8, chloride 100, bicarbonate 26, BUN 17, creatinine 0.99, glucose 323. LFTs within normal limits. WBC is 7.8, hemoglobin 12.2, platelets of 165,000. Urinalysis: WBCs 56, RBCs 5, leukocyte esterase 1+, nitrate negative, epithelial cells 5  ASSESSMENT AND PLAN: An 81 year old Caucasian female with a history of dementia, diastolic congestive heart failure, cerebrovascular accident without residual neurological deficits, type 2 diabetes non-insulin-requiring, uncomplicated, presenting after a mechanical fall, found to have left femoral neck fracture.  1.  Left hip fracture nondisplaced. Orthopedic consult as they have already evaluated the patient in the Emergency Department. Make her n.[name] Operating room planning for the morning.  2.  Preoperative evaluation. Mets to be considered less than 4 given her general lack of ability. She has no active signs of angina, congestive heart failure, significant valvular dysfunction, or arrhythmias. Thus, no further testing required prior to surgery. She should be considered a moderate risk for moderate risk surgery from a cardiac standpoint. As far as medications are concerned, will hold her [name] diabetic agents, as well as ACE inhibitor which can be restarted postoperatively to help avoid intraoperative hypotension. Otherwise, continue all of her home medications.  3.  Urinary tract infection. Will follow culture data. Ceftriaxone for antibiotic coverage.  4.  Type 2 diabetes, uncomplicated, non-insulin-requiring. Hold [name] agents. Add insulin sliding scale with q.6 hour Accu-Cheks.  5.  Hypothyroidism. Continue the Synthroid.  6.  Venous thromboembolism prophylaxis with sequential compression devices.  CODE STATUS:  Patient is full code.   TIME SPENT: 45 minutes   ____________________________ Cletis Athens. Hower,  MD dkh:am D: 06/15/2014 23:52:30 ET T: 06/16/2014 00:27:13 ET JOB#: [id number]  cc: Cletis Athens. Hower, MD, <Dictator> DAVID Synetta Shadow MD ELECTRONICALLY SIGNED 06/16/2014 20:30

## 2014-12-14 LAB — SURGICAL PATHOLOGY

## 2015-07-04 ENCOUNTER — Inpatient Hospital Stay
Admission: EM | Admit: 2015-07-04 | Discharge: 2015-07-08 | DRG: 871 | Disposition: A | Discharge status: Skilled Nursing Facility | Payer: Medicare Other | Attending: Internal Medicine | Admitting: Internal Medicine

## 2015-07-04 ENCOUNTER — Emergency Department: Payer: Medicare Other

## 2015-07-04 DIAGNOSIS — J690 Pneumonitis due to inhalation of food and vomit: Secondary | ICD-10-CM

## 2015-07-04 DIAGNOSIS — N183 Chronic kidney disease, stage 3 (moderate): Secondary | ICD-10-CM | POA: Diagnosis present

## 2015-07-04 DIAGNOSIS — I739 Peripheral vascular disease, unspecified: Secondary | ICD-10-CM | POA: Diagnosis present

## 2015-07-04 DIAGNOSIS — Z888 Allergy status to other drugs, medicaments and biological substances status: Secondary | ICD-10-CM

## 2015-07-04 DIAGNOSIS — I129 Hypertensive chronic kidney disease with stage 1 through stage 4 chronic kidney disease, or unspecified chronic kidney disease: Secondary | ICD-10-CM | POA: Diagnosis present

## 2015-07-04 DIAGNOSIS — J9601 Acute respiratory failure with hypoxia: Secondary | ICD-10-CM | POA: Diagnosis present

## 2015-07-04 DIAGNOSIS — F319 Bipolar disorder, unspecified: Secondary | ICD-10-CM | POA: Diagnosis present

## 2015-07-04 DIAGNOSIS — E872 Acidosis: Secondary | ICD-10-CM | POA: Diagnosis present

## 2015-07-04 DIAGNOSIS — Z8673 Personal history of transient ischemic attack (TIA), and cerebral infarction without residual deficits: Secondary | ICD-10-CM

## 2015-07-04 DIAGNOSIS — Z79899 Other long term (current) drug therapy: Secondary | ICD-10-CM | POA: Diagnosis not present

## 2015-07-04 DIAGNOSIS — N189 Chronic kidney disease, unspecified: Secondary | ICD-10-CM | POA: Diagnosis present

## 2015-07-04 DIAGNOSIS — M810 Age-related osteoporosis without current pathological fracture: Secondary | ICD-10-CM | POA: Diagnosis present

## 2015-07-04 DIAGNOSIS — A419 Sepsis, unspecified organism: Secondary | ICD-10-CM | POA: Diagnosis not present

## 2015-07-04 DIAGNOSIS — Z66 Do not resuscitate: Secondary | ICD-10-CM | POA: Diagnosis present

## 2015-07-04 DIAGNOSIS — J9600 Acute respiratory failure, unspecified whether with hypoxia or hypercapnia: Secondary | ICD-10-CM

## 2015-07-04 DIAGNOSIS — R0602 Shortness of breath: Secondary | ICD-10-CM

## 2015-07-04 DIAGNOSIS — F0390 Unspecified dementia without behavioral disturbance: Secondary | ICD-10-CM | POA: Diagnosis present

## 2015-07-04 DIAGNOSIS — Z9660 Presence of unspecified orthopedic joint implant: Secondary | ICD-10-CM | POA: Diagnosis present

## 2015-07-04 DIAGNOSIS — E1122 Type 2 diabetes mellitus with diabetic chronic kidney disease: Secondary | ICD-10-CM | POA: Diagnosis present

## 2015-07-04 DIAGNOSIS — E039 Hypothyroidism, unspecified: Secondary | ICD-10-CM | POA: Diagnosis present

## 2015-07-04 DIAGNOSIS — I5032 Chronic diastolic (congestive) heart failure: Secondary | ICD-10-CM | POA: Diagnosis present

## 2015-07-04 HISTORY — DX: Major depressive disorder, single episode, unspecified: F32.9

## 2015-07-04 HISTORY — DX: Transient cerebral ischemic attack, unspecified: G45.9

## 2015-07-04 HISTORY — DX: Hypothyroidism, unspecified: E03.9

## 2015-07-04 HISTORY — DX: Peripheral vascular disease, unspecified: I73.9

## 2015-07-04 HISTORY — DX: Disorder of kidney and ureter, unspecified: N28.9

## 2015-07-04 HISTORY — DX: Hyperlipidemia, unspecified: E78.5

## 2015-07-04 HISTORY — DX: Cerebral infarction, unspecified: I63.9

## 2015-07-04 HISTORY — DX: Age-related osteoporosis without current pathological fracture: M81.0

## 2015-07-04 HISTORY — DX: Schizophrenia, unspecified: F20.9

## 2015-07-04 HISTORY — DX: Bipolar disorder, unspecified: F31.9

## 2015-07-04 HISTORY — DX: Anemia, unspecified: D64.9

## 2015-07-04 LAB — CBC WITH DIFFERENTIAL/PLATELET
BASOS ABS: 0 10*3/uL (ref 0–0.1)
BASOS PCT: 0 %
Eosinophils Absolute: 0 10*3/uL (ref 0–0.7)
Eosinophils Relative: 0 %
HEMATOCRIT: 41 % (ref 35.0–47.0)
Hemoglobin: 13.5 g/dL (ref 12.0–16.0)
LYMPHS PCT: 9 %
Lymphs Abs: 1.6 10*3/uL (ref 1.0–3.6)
MCH: 30.2 pg (ref 26.0–34.0)
MCHC: 32.8 g/dL (ref 32.0–36.0)
MCV: 92 fL (ref 80.0–100.0)
MONO ABS: 1.5 10*3/uL — AB (ref 0.2–0.9)
Monocytes Relative: 8 %
NEUTROS ABS: 15 10*3/uL — AB (ref 1.4–6.5)
NEUTROS PCT: 83 %
PLATELETS: 253 10*3/uL (ref 150–440)
RBC: 4.46 MIL/uL (ref 3.80–5.20)
RDW: 13.7 % (ref 11.5–14.5)
WBC: 18.2 10*3/uL — ABNORMAL HIGH (ref 3.6–11.0)

## 2015-07-04 LAB — TROPONIN I
TROPONIN I: 0.05 ng/mL — AB (ref <0.031)
TROPONIN I: 0.05 ng/mL — AB (ref <0.031)
Troponin I: 0.05 ng/mL — ABNORMAL HIGH (ref <0.031)

## 2015-07-04 LAB — COMPREHENSIVE METABOLIC PANEL
ALBUMIN: 3.9 g/dL (ref 3.5–5.0)
ALT: 11 U/L — ABNORMAL LOW (ref 14–54)
ANION GAP: 11 (ref 5–15)
AST: 19 U/L (ref 15–41)
Alkaline Phosphatase: 63 U/L (ref 38–126)
BUN: 27 mg/dL — AB (ref 6–20)
CHLORIDE: 101 mmol/L (ref 101–111)
CO2: 24 mmol/L (ref 22–32)
Calcium: 9.8 mg/dL (ref 8.9–10.3)
Creatinine, Ser: 1.24 mg/dL — ABNORMAL HIGH (ref 0.44–1.00)
GFR calc Af Amer: 45 mL/min — ABNORMAL LOW (ref >60)
GFR, EST NON AFRICAN AMERICAN: 38 mL/min — AB (ref >60)
GLUCOSE: 369 mg/dL — AB (ref 65–99)
POTASSIUM: 4.5 mmol/L (ref 3.5–5.1)
Sodium: 136 mmol/L (ref 135–145)
TOTAL PROTEIN: 7.7 g/dL (ref 6.5–8.1)
Total Bilirubin: 1.4 mg/dL — ABNORMAL HIGH (ref 0.3–1.2)

## 2015-07-04 LAB — URINALYSIS COMPLETE WITH MICROSCOPIC (ARMC ONLY)
BILIRUBIN URINE: NEGATIVE
GLUCOSE, UA: 50 mg/dL — AB
Nitrite: NEGATIVE
Protein, ur: >500 mg/dL — AB
Specific Gravity, Urine: 1.014 (ref 1.005–1.030)
pH: 8 (ref 5.0–8.0)

## 2015-07-04 LAB — MRSA PCR SCREENING: MRSA BY PCR: NEGATIVE

## 2015-07-04 LAB — GLUCOSE, CAPILLARY
GLUCOSE-CAPILLARY: 320 mg/dL — AB (ref 65–99)
GLUCOSE-CAPILLARY: 260 mg/dL — AB (ref 65–99)
Glucose-Capillary: 171 mg/dL — ABNORMAL HIGH (ref 65–99)
Glucose-Capillary: 380 mg/dL — ABNORMAL HIGH (ref 65–99)

## 2015-07-04 LAB — LACTIC ACID, PLASMA
LACTIC ACID, VENOUS: 2.5 mmol/L — AB (ref 0.5–2.0)
LACTIC ACID, VENOUS: 2.2 mmol/L — AB (ref 0.5–2.0)

## 2015-07-04 LAB — PROTIME-INR
INR: 1.11
PROTHROMBIN TIME: 14.5 s (ref 11.4–15.0)

## 2015-07-04 LAB — BRAIN NATRIURETIC PEPTIDE: B Natriuretic Peptide: 123 pg/mL — ABNORMAL HIGH (ref 0.0–100.0)

## 2015-07-04 MED ORDER — FERROUS SULFATE 325 (65 FE) MG PO TABS
325.0000 mg | ORAL_TABLET | Freq: Every day | ORAL | Status: DC
  Administered 2015-07-04 – 2015-07-07 (×4): 325 mg via ORAL
  Filled 2015-07-04 (×5): qty 1

## 2015-07-04 MED ORDER — HEPARIN SODIUM (PORCINE) 5000 UNIT/ML IJ SOLN
5000.0000 [IU] | Freq: Three times a day (TID) | INTRAMUSCULAR | Status: DC
  Administered 2015-07-04 – 2015-07-08 (×12): 5000 [IU] via SUBCUTANEOUS
  Filled 2015-07-04 (×12): qty 1

## 2015-07-04 MED ORDER — MORPHINE SULFATE (PF) 2 MG/ML IV SOLN: 2.0000 mg | INTRAVENOUS | Status: DC | PRN

## 2015-07-04 MED ORDER — PIPERACILLIN-TAZOBACTAM 3.375 G IVPB
3.3750 g | Freq: Once | INTRAVENOUS | Status: AC
  Administered 2015-07-04: 3.375 g via INTRAVENOUS
  Filled 2015-07-04: qty 50

## 2015-07-04 MED ORDER — PIOGLITAZONE HCL-METFORMIN HCL 15-500 MG PO TABS: 1.0000 | ORAL_TABLET | Freq: Two times a day (BID) | ORAL | Status: DC

## 2015-07-04 MED ORDER — LEVOTHYROXINE SODIUM 75 MCG PO TABS
125.0000 ug | ORAL_TABLET | Freq: Every day | ORAL | Status: DC
  Administered 2015-07-05 – 2015-07-08 (×4): 125 ug via ORAL
  Filled 2015-07-04 (×6): qty 1

## 2015-07-04 MED ORDER — VANCOMYCIN HCL IN DEXTROSE 750-5 MG/150ML-% IV SOLN
750.0000 mg | INTRAVENOUS | Status: DC
  Administered 2015-07-05 – 2015-07-06 (×2): 750 mg via INTRAVENOUS
  Filled 2015-07-04 (×4): qty 150

## 2015-07-04 MED ORDER — SODIUM CHLORIDE 0.9 % IV SOLN
INTRAVENOUS | Status: DC
  Administered 2015-07-04 – 2015-07-06 (×4): via INTRAVENOUS

## 2015-07-04 MED ORDER — PIPERACILLIN-TAZOBACTAM 3.375 G IVPB
3.3750 g | Freq: Three times a day (TID) | INTRAVENOUS | Status: DC
  Filled 2015-07-04 (×2): qty 50

## 2015-07-04 MED ORDER — ACETAMINOPHEN 650 MG RE SUPP: 650.0000 mg | Freq: Four times a day (QID) | RECTAL | Status: DC | PRN

## 2015-07-04 MED ORDER — ONDANSETRON HCL 4 MG PO TABS: 4.0000 mg | ORAL_TABLET | Freq: Four times a day (QID) | ORAL | Status: DC | PRN

## 2015-07-04 MED ORDER — ACETAMINOPHEN 325 MG PO TABS: 650.0000 mg | ORAL_TABLET | Freq: Four times a day (QID) | ORAL | Status: DC | PRN

## 2015-07-04 MED ORDER — VANCOMYCIN HCL IN DEXTROSE 1-5 GM/200ML-% IV SOLN
1000.0000 mg | Freq: Once | INTRAVENOUS | Status: AC
  Administered 2015-07-04: 1000 mg via INTRAVENOUS
  Filled 2015-07-04: qty 200

## 2015-07-04 MED ORDER — IPRATROPIUM-ALBUTEROL 0.5-2.5 (3) MG/3ML IN SOLN
3.0000 mL | Freq: Four times a day (QID) | RESPIRATORY_TRACT | Status: DC
  Administered 2015-07-04 – 2015-07-07 (×7): 3 mL via RESPIRATORY_TRACT
  Filled 2015-07-04 (×10): qty 3

## 2015-07-04 MED ORDER — INSULIN ASPART 100 UNIT/ML ~~LOC~~ SOLN
0.0000 [IU] | SUBCUTANEOUS | Status: DC
  Administered 2015-07-04: 20:00:00 5 [IU] via SUBCUTANEOUS
  Administered 2015-07-04: 12:00:00 9 [IU] via SUBCUTANEOUS
  Administered 2015-07-04: 18:00:00 7 [IU] via SUBCUTANEOUS
  Administered 2015-07-05 (×3): 3 [IU] via SUBCUTANEOUS
  Administered 2015-07-05: 2 [IU] via SUBCUTANEOUS
  Administered 2015-07-05 (×2): 3 [IU] via SUBCUTANEOUS
  Administered 2015-07-06: 2 [IU] via SUBCUTANEOUS
  Administered 2015-07-06: 04:00:00 3 [IU] via SUBCUTANEOUS
  Administered 2015-07-06 (×2): 2 [IU] via SUBCUTANEOUS
  Administered 2015-07-06: 3 [IU] via SUBCUTANEOUS
  Administered 2015-07-06: 1 [IU] via SUBCUTANEOUS
  Administered 2015-07-06: 18:00:00 2 [IU] via SUBCUTANEOUS
  Administered 2015-07-07: 20:00:00 3 [IU] via SUBCUTANEOUS
  Administered 2015-07-07 (×3): 2 [IU] via SUBCUTANEOUS
  Administered 2015-07-07: 3 [IU] via SUBCUTANEOUS
  Administered 2015-07-07: 17:00:00 2 [IU] via SUBCUTANEOUS
  Administered 2015-07-08 (×2): 1 [IU] via SUBCUTANEOUS
  Administered 2015-07-08: 13:00:00 3 [IU] via SUBCUTANEOUS
  Filled 2015-07-04 (×3): qty 3
  Filled 2015-07-04 (×2): qty 2
  Filled 2015-07-04: qty 3
  Filled 2015-07-04: qty 2
  Filled 2015-07-04: qty 1
  Filled 2015-07-04: qty 2
  Filled 2015-07-04: qty 1
  Filled 2015-07-04: qty 3
  Filled 2015-07-04 (×3): qty 2
  Filled 2015-07-04: qty 3
  Filled 2015-07-04: qty 9
  Filled 2015-07-04 (×2): qty 3
  Filled 2015-07-04: qty 6
  Filled 2015-07-04 (×2): qty 2
  Filled 2015-07-04 (×2): qty 3
  Filled 2015-07-04: qty 1
  Filled 2015-07-04: qty 5

## 2015-07-04 MED ORDER — FLUPHENAZINE HCL 2.5 MG PO TABS
2.5000 mg | ORAL_TABLET | Freq: Every day | ORAL | Status: DC
  Administered 2015-07-04 – 2015-07-07 (×4): 2.5 mg via ORAL
  Filled 2015-07-04 (×5): qty 1

## 2015-07-04 MED ORDER — METFORMIN HCL 500 MG PO TABS
500.0000 mg | ORAL_TABLET | Freq: Two times a day (BID) | ORAL | Status: DC
  Administered 2015-07-04 (×2): 500 mg via ORAL
  Filled 2015-07-04 (×2): qty 1

## 2015-07-04 MED ORDER — LISINOPRIL 20 MG PO TABS
20.0000 mg | ORAL_TABLET | Freq: Every day | ORAL | Status: DC
  Administered 2015-07-04 – 2015-07-08 (×5): 20 mg via ORAL
  Filled 2015-07-04 (×5): qty 1

## 2015-07-04 MED ORDER — PANTOPRAZOLE SODIUM 40 MG PO TBEC
40.0000 mg | DELAYED_RELEASE_TABLET | Freq: Two times a day (BID) | ORAL | Status: DC
  Administered 2015-07-04 – 2015-07-07 (×8): 40 mg via ORAL
  Filled 2015-07-04 (×18): qty 1

## 2015-07-04 MED ORDER — LORATADINE 10 MG PO TABS: 10.0000 mg | ORAL_TABLET | Freq: Every day | ORAL | Status: DC | PRN

## 2015-07-04 MED ORDER — METHYLPREDNISOLONE SODIUM SUCC 125 MG IJ SOLR
60.0000 mg | Freq: Two times a day (BID) | INTRAMUSCULAR | Status: DC
  Administered 2015-07-04: 11:00:00 via INTRAVENOUS
  Administered 2015-07-04 – 2015-07-07 (×6): 60 mg via INTRAVENOUS
  Filled 2015-07-04 (×7): qty 2

## 2015-07-04 MED ORDER — DOCUSATE SODIUM 100 MG PO CAPS: 100.0000 mg | ORAL_CAPSULE | Freq: Two times a day (BID) | ORAL | Status: DC | PRN

## 2015-07-04 MED ORDER — DULOXETINE HCL 30 MG PO CPEP
30.0000 mg | ORAL_CAPSULE | Freq: Every day | ORAL | Status: DC
  Administered 2015-07-04 – 2015-07-07 (×4): 30 mg via ORAL
  Filled 2015-07-04 (×5): qty 1

## 2015-07-04 MED ORDER — DULOXETINE HCL 20 MG PO CPEP
20.0000 mg | ORAL_CAPSULE | Freq: Every day | ORAL | Status: DC
  Administered 2015-07-04 – 2015-07-06 (×3): 20 mg via ORAL
  Administered 2015-07-07: 11:00:00 via ORAL
  Filled 2015-07-04 (×5): qty 1

## 2015-07-04 MED ORDER — PIPERACILLIN-TAZOBACTAM 3.375 G IVPB
3.3750 g | Freq: Three times a day (TID) | INTRAVENOUS | Status: DC
  Administered 2015-07-04 – 2015-07-08 (×12): 3.375 g via INTRAVENOUS
  Filled 2015-07-04 (×17): qty 50

## 2015-07-04 MED ORDER — ASPIRIN EC 81 MG PO TBEC
81.0000 mg | DELAYED_RELEASE_TABLET | Freq: Every day | ORAL | Status: DC
  Administered 2015-07-04 – 2015-07-07 (×3): 81 mg via ORAL
  Filled 2015-07-04 (×5): qty 1

## 2015-07-04 MED ORDER — SODIUM CHLORIDE 0.9 % IJ SOLN
3.0000 mL | Freq: Two times a day (BID) | INTRAMUSCULAR | Status: DC
  Administered 2015-07-04 – 2015-07-08 (×9): 3 mL via INTRAVENOUS

## 2015-07-04 MED ORDER — MAGNESIUM OXIDE 400 (241.3 MG) MG PO TABS
400.0000 mg | ORAL_TABLET | Freq: Two times a day (BID) | ORAL | Status: DC
  Administered 2015-07-04 – 2015-07-07 (×8): 400 mg via ORAL
  Filled 2015-07-04 (×9): qty 1

## 2015-07-04 MED ORDER — CARVEDILOL 3.125 MG PO TABS
12.5000 mg | ORAL_TABLET | Freq: Two times a day (BID) | ORAL | Status: DC
  Administered 2015-07-04 – 2015-07-07 (×7): 12.5 mg via ORAL
  Filled 2015-07-04 (×7): qty 4

## 2015-07-04 MED ORDER — ARTIFICIAL TEARS OP OINT
1.0000 "application " | TOPICAL_OINTMENT | Freq: Every day | OPHTHALMIC | Status: DC
  Administered 2015-07-04 – 2015-07-07 (×4): 1 via OPHTHALMIC
  Filled 2015-07-04: qty 3.5

## 2015-07-04 MED ORDER — SODIUM CHLORIDE 0.9 % IV BOLUS (SEPSIS)
500.0000 mL | Freq: Once | INTRAVENOUS | Status: AC
  Administered 2015-07-04: 500 mL via INTRAVENOUS

## 2015-07-04 MED ORDER — ONDANSETRON HCL 4 MG/2ML IJ SOLN: 4.0000 mg | Freq: Four times a day (QID) | INTRAMUSCULAR | Status: DC | PRN

## 2015-07-04 MED ORDER — DIVALPROEX SODIUM 125 MG PO DR TAB
125.0000 mg | DELAYED_RELEASE_TABLET | Freq: Three times a day (TID) | ORAL | Status: DC
  Administered 2015-07-04 – 2015-07-08 (×12): 125 mg via ORAL
  Filled 2015-07-04 (×15): qty 1

## 2015-07-04 MED ORDER — BISACODYL 10 MG RE SUPP: 10.0000 mg | Freq: Every day | RECTAL | Status: DC | PRN

## 2015-07-04 MED ORDER — IPRATROPIUM-ALBUTEROL 0.5-2.5 (3) MG/3ML IN SOLN
3.0000 mL | Freq: Once | RESPIRATORY_TRACT | Status: AC
  Administered 2015-07-04: 3 mL via RESPIRATORY_TRACT
  Filled 2015-07-04: qty 3

## 2015-07-04 MED ORDER — SODIUM CHLORIDE 0.9 % IV BOLUS (SEPSIS)
1000.0000 mL | Freq: Once | INTRAVENOUS | Status: AC
  Administered 2015-07-04: 1000 mL via INTRAVENOUS

## 2015-07-04 MED ORDER — PIOGLITAZONE HCL 15 MG PO TABS
15.0000 mg | ORAL_TABLET | Freq: Two times a day (BID) | ORAL | Status: DC
  Administered 2015-07-04 (×2): 15 mg via ORAL
  Filled 2015-07-04 (×4): qty 1

## 2015-07-04 NOTE — ED Notes (Addendum)
BIB EMS from [facility].  Nursing home reports last night pt aspirated and they suctioned her. Pts sats in high 80s to low 90s. Pt 83 on r/a upon arrival. Put on 3L [place]. Pt is tachypneic and has labored breathing.

## 2015-07-04 NOTE — Progress Notes (Signed)
Gave pastoral support & prayer Chaplain Ronney LionDonna S Anaeli Cornwall Ext [phone]

## 2015-07-04 NOTE — Plan of Care (Signed)
Pt admitted today w/aspiration PNA.  Hx of DM, CKD, HTN and bipolar disorder. Very confused - perseverates a lot.  Pt will have swallow eval tomorrow - currently NPO except for sips w/meds.  Pt is MRSA by PCR negative.  Pt getting breathing tx, IV abx and steroids.  Blood sugars have been elevated.  Labs and chest xray will be repeated tomorrow. Pt's son was at bedside at admission.

## 2015-07-04 NOTE — Progress Notes (Signed)
ANTIBIOTIC CONSULT NOTE - INITIAL  Pharmacy Consult for Vancomycin Indication: pneumonia  Allergies  Allergen Reactions  . Ciprocin-Fluocin-Procin [Fluocinolone] Other (See Comments)    Unknown reaction  . Risperdal [Risperidone] Other (See Comments)    Unknown reaction  . Rosuvastatin Other (See Comments)    Unknown reaction    Patient Measurements: Height: 5\' 7"  (170.2 cm) Weight: 143 lb (64.864 kg) IBW/kg (Calculated) : 61.6 Adjusted Body Weight: 63 kg  Vital Signs: Temp: 100.4 F (38 C) (11/13 0943) Temp Source: Rectal (11/13 0943) BP: 149/87 mmHg (11/13 0853) Pulse Rate: 141 (11/13 0853) Intake/Output from previous day:   Intake/Output from this shift:    Labs:  Recent Labs  07/04/15 0911  WBC 18.2*  HGB 13.5  PLT 253  CREATININE 1.24*   Estimated Creatinine Clearance: 32.3 mL/min (by C-G formula based on Cr of 1.24). No results for input(s): VANCOTROUGH, VANCOPEAK, VANCORANDOM, GENTTROUGH, GENTPEAK, GENTRANDOM, TOBRATROUGH, TOBRAPEAK, TOBRARND, AMIKACINPEAK, AMIKACINTROU, AMIKACIN in the last 72 hours.   Microbiology: No results found for this or any previous visit (from the past 720 hour(s)).  Medical History: Past Medical History  Diagnosis Date  . Bipolar 1 disorder (HCC)   . Kidney disease   . Hypothyroidism vitamin b12  . Anemia   . Hyperlipidemia   . MDD (major depressive disorder) (HCC)   . TIA (transient ischemic attack)   . Cerebral infarction (HCC)   . Schizophrenia (HCC)   . Osteoporosis   . Peripheral vascular disease (HCC)     Medications:  Scheduled:  . insulin aspart  0-9 Units Subcutaneous 6 times per day  . ipratropium-albuterol  3 mL Nebulization QID  . methylPREDNISolone (SOLU-MEDROL) injection  60 mg Intravenous Q12H  . vancomycin  750 mg Intravenous Q24H   Assessment:  Patient is an 81 yo female admitted for pneumonia.  MD desires empiric coverage with Vancomycin and Zosyn.    SCr: 1.24, est CrCl~32 mL/min, ke:  0.031, t1/2: 22.4h, Vd: 44.1L  Goal of Therapy:  Vancomycin trough level 15-20 mcg/ml  Plan:  Patient received Vancomycin 1 gm IV once at 0920.  Will start stacked dose of 750 mg 14 hours after first dose then start Vancomycin 750 mg IV q24h.  Will check trough prior to 4th dose on 11/16 at 2300.    Pharmacy will continue to follow.  Daisuke Bailey G 07/04/2015,10:36 AM

## 2015-07-04 NOTE — ED Notes (Signed)
Critical lab results reported to quale MD

## 2015-07-04 NOTE — Clinical Social Work Note (Signed)
Clinical Social Work Assessment  Patient Details  Name: Leslie Savage MRN: [id number] Date of Birth: 04/18/1928  Date of referral:  07/04/15               Reason for consult:  Facility Placement                Permission sought to share information with:   (Patient was documented as incoherent today on admission in ED) Permission granted to share information::     Name::        Agency::     Relationship::     Contact Information:     Housing/Transportation Living arrangements for the past 2 months:  Skilled Nursing Facility Source of Information:  Adult Children Patient Interpreter Needed:  None Criminal Activity/Legal Involvement Pertinent to Current Situation/Hospitalization:  No - Comment as needed Significant Relationships:  Adult Children Lives with:  Facility Resident Do you feel safe going back to the place where you live?   (patient unable to answer) Need for family participation in patient care:  No (Coment)  Care giving concerns:  Patient is a long term resident at [facility].    Social Worker assessment / plan:  CSW contacted patient's son: Leslie Savage:: [phone] via phone. Leslie Savage was very willing to speak with CSW and stated patient has been a long term resident at [facility] for 4 years. He wishes for patient to [facility] at discharge.   Employment status:  Retired Database administratornsurance information:  Managed Medicare PT Recommendations:  Not assessed at this time Information / Referral to community resources:     Patient/Family's Response to care:  Patient's son expressed appreciation for CSW call.  Patient/Family's Understanding of and Emotional Response to Diagnosis, Current Treatment, and Prognosis:  Patient's son verbalizes understanding of current patient situation and the wish for [facility] at discharge. He was with patient in the ED and in her hospital room until this afternoon when he left for a little bit.  Emotional Assessment Appearance:   Appears stated age Attitude/Demeanor/Rapport:  Unable to Assess Affect (typically observed):  Unable to Assess Orientation:  Fluctuating Orientation (Suspected and/or reported Sundowners) Alcohol / Substance use:  Not Applicable Psych involvement (Current and /or in the community):  No (Comment)  Discharge Needs  Concerns to be addressed:  Care Coordination Readmission within the last 30 days:  No Current discharge risk:  None Barriers to Discharge:  No Barriers Identified   Leslie SpanielMonica Melaney Tellefsen, LCSW 07/04/2015, 3:07 PM

## 2015-07-04 NOTE — ED Notes (Signed)
Son at bedside.

## 2015-07-04 NOTE — ED Provider Notes (Signed)
[facility] Emergency Department Provider Note  REMINDER - THIS NOTE IS NOT A FINAL MEDICAL RECORD UNTIL IT IS SIGNED. UNTIL THEN, THE CONTENT BELOW MAY REFLECT INFORMATION FROM A DOCUMENTATION TEMPLATE, NOT THE ACTUAL PATIENT VISIT. ____________________________________________  Time seen: Approximately 9:02 AM  I have reviewed the triage vital signs and the nursing notes.  EM caveat: History and physical as well as review of systems are very limited due to patient's dyspnea. Acuity of condition. Dementia.  HISTORY  Chief Complaint Aspiration    HPI Leslie Savage is a 81 y.o. female previous history or tract infection, diastolic heart failure, dementia.  Patient presents for shortness of breath. Evidently was thought to have aspirated yesterday and required suctioning throughout the evening at the nursing home. She continued to calm state throughout the evening and EMS was called this morning due to hypoxia. With EMS the patient's initial oxygen saturation was noted to be as low as 83%, and end-tidal CO2 use as low as 20. She is in respiratory distress, with rhonchorous lung sounds bilaterally per EMS.  Past Medical History  Diagnosis Date  . Bipolar 1 disorder (HCC)   . Kidney disease   . Hypothyroidism vitamin b12  . Anemia   . Hyperlipidemia   . MDD (major depressive disorder) (HCC)   . TIA (transient ischemic attack)   . Cerebral infarction (HCC)   . Schizophrenia (HCC)   . Osteoporosis   . Peripheral vascular disease (HCC)     There are no active problems to display for this patient.   Past Surgical History  Procedure Laterality Date  . Joint replacement      No current outpatient prescriptions on file.  Allergies Ciprocin-fluocin-procin; Risperdal; and Rosuvastatin  No family history on file.  Social History Social History  Substance Use Topics  . Smoking status: None  . Smokeless tobacco: None  . Alcohol Use: None    Review  of Systems Unable except patient notes that she is very short of breath, having trouble breathing. She denies being in "pain". ____________________________________________   PHYSICAL EXAM:  VITAL SIGNS: ED Triage Vitals  Enc Vitals Group     BP 07/04/15 0853 149/87 mmHg     Pulse Rate 07/04/15 0853 141     Resp --      Temp --      Temp src --      SpO2 07/04/15 0853 94 %     Weight --      Height --      Head Cir --      Peak Flow --      Pain Score --      Pain Loc --      Pain Edu? --      Excl. in GC? --    Constitutional: Alert and oriented to self. She appears in distress having significant difficulty breathing. Eyes: Conjunctivae are normal, patient does have slight purulent discharge from both eyes noted bilaterally. PERRL. EOMI. Head: Atraumatic. Nose: No congestion/rhinnorhea. Mouth/Throat: Mucous membranes are dry.  Oropharynx non-erythematous. Neck: No stridor.   Cardiovascular: Tachycardic rate, regular rhythm. Grossly normal heart sounds.  Good peripheral circulation. Respiratory: Patient with significant cough, rhonchorous lung sounds. She demonstrates moderate increased work of breathing, she is protecting her airway but is sitting upright, appears in moderate distress with very heavy rhonchorous lung sounds to the central chest. No wheezing is heard. She is using accessory muscles. She is always be able to speak one to  2 words at a time. Gastrointestinal: Soft and nontender. No distention. Musculoskeletal:  No joint effusions. Neurologic:   No gross focal neurologic deficits are appreciated.  Skin:  Skin is cool and slightly mottled in the distal extremities. Psychiatric: Patient appears anxious  ____________________________________________   LABS (all labs ordered are listed, but only abnormal results are displayed)  Labs Reviewed  LACTIC ACID, PLASMA - Abnormal; Notable for the following:    Lactic Acid, Venous 2.5 (*)    All other components within  normal limits  COMPREHENSIVE METABOLIC PANEL - Abnormal; Notable for the following:    Glucose, Bld 369 (*)    BUN 27 (*)    Creatinine, Ser 1.24 (*)    ALT 11 (*)    Total Bilirubin 1.4 (*)    GFR calc non Af Amer 38 (*)    GFR calc Af Amer 45 (*)    All other components within normal limits  TROPONIN I - Abnormal; Notable for the following:    Troponin I 0.05 (*)    All other components within normal limits  CBC WITH DIFFERENTIAL/PLATELET - Abnormal; Notable for the following:    WBC 18.2 (*)    Neutro Abs 15.0 (*)    Monocytes Absolute 1.5 (*)    All other components within normal limits  BRAIN NATRIURETIC PEPTIDE - Abnormal; Notable for the following:    B Natriuretic Peptide 123.0 (*)    All other components within normal limits  BLOOD GAS, VENOUS - Abnormal; Notable for the following:    pCO2, Ven 36 (*)    pO2, Ven 47.0 (*)    All other components within normal limits  CULTURE, BLOOD (ROUTINE X 2)  CULTURE, BLOOD (ROUTINE X 2)  URINE CULTURE  PROTIME-INR  LACTIC ACID, PLASMA  URINALYSIS COMPLETEWITH MICROSCOPIC ([facility] ONLY)   ____________________________________________  EKG  Reviewed and interpreted by me EKG time 9 AM Sinus tachycardia, ventricular rate 150 QTc 4:30 T-wave depressions noted in the lateral distribution, possibly indicative of ischemic change so no ST elevation is seen PR interval 110 ____________________________________________  RADIOLOGY  DG Chest Port 1 View (Final result) Result time: 07/04/15 09:32:07   Final result by Rad Results In Interface (07/04/15 09:32:07)   Narrative:   CLINICAL DATA: Low O2 sats. Aspiration last night per nursing home.  EXAM: PORTABLE CHEST 1 VIEW  COMPARISON: Chest x-ray dated 06/15/2014.  FINDINGS: Patchy airspace opacities at the left lung base may be related to the given history of aspiration. Alternatively atelectasis.  The mild interstitial coarsening is stable compared to multiple prior  studies, presumably mild chronic fibrosis.  Heart size is upper normal. Overall cardiomediastinal silhouette is stable in size and configuration. Atherosclerotic calcifications again noted at the aortic knob.  No large pleural effusion. No pneumothorax. No acute osseous abnormality.  IMPRESSION: Patchy airspace opacities at the left lung base which may be related to the history of recent aspiration. Pneumonia or atelectasis are also possibilities.  Chronic interstitial thickening bilaterally suggesting some degree of interstitial fibrosis.     ____________________________________________   PROCEDURES  Procedure(s) performed: None  Critical Care performed: Yes, see critical care note(s)  CRITICAL CARE Performed by: Sharyn Creamer   Total critical care time: 35 minutes  Critical care time was exclusive of separately billable procedures and treating other patients.  Critical care was necessary to treat or prevent imminent or life-threatening deterioration.  Critical care was time spent personally by me on the following activities: development of treatment plan with patient  and/or surrogate as well as nursing, discussions with consultants, evaluation of patient's response to treatment, examination of patient, obtaining history from patient or surrogate, ordering and performing treatments and interventions, ordering and review of laboratory studies, ordering and review of radiographic studies, pulse oximetry and re-evaluation of patient's condition.  Evaluation includes evaluating patient for sepsis, early goal directed therapy to reduce associated morbidity mortality. Early antibiotic initiation. Patient required urgent/emergent evaluation by ED physician for initiation of treatment to prevent worsening of her condition and stabilizer. Respiratory distress. ____________________________________________   INITIAL IMPRESSION / ASSESSMENT AND PLAN / ED COURSE  Pertinent labs &  imaging results that were available during my care of the patient were reviewed by me and considered in my medical decision making (see chart for details).  Patient presents with respiratory distress. The present time she demonstrates square end-tidal waveforms, no evidence of obstructive disease. No wheezing. End-tidal CO2 is 20, the patient is to And has significant rhonchorous lung sounds. Certainly this is compatible with a stated history of possible aspiration, however given her condition other strong considerations would include pneumonia, sepsis, congestive heart failure though I find this to be slightly less likely that she does give a history of this, acute cardiac or other infectious etiologies or toxic metabolic presentations. No acute new neurologic deficits.  Patient is in significant distress, she is anxious given her age and recent aspiration and do not believe she would tolerate BiPAP well but she does correct with oxygen administration, we will obtain chest x-ray to monitor closely. Hopefully, she will show improvement but if the patient continues to decompensate certainly consideration for requirement of intubation is considered though given this presentation and her age, I believe the patient likely fair better for did not require intubation. Respiratory therapy is also inpatient evaluate the patient for suctioning.  ----------------------------------------- 9:46 AM on 07/04/2015 -----------------------------------------  Reevaluation  Patient condition improving. Heart rate now down to 1:15. Her blood pressure remained stable. Oxygen saturation about 94% on 3 L nasal cannula. Overall her condition appears to be improved with slight improvement work of breathing. She does have a low-grade temperature, chest x-ray reviewed by me as well as radiologist is very suspicious for pneumonia given her clinical picture. IV antibiotics initiated, I've called to admit the patient to hospitalist  service for further workup and evaluation. At this point, my primary impression is likely aspiration pneumonia. Continue to wait a lactic acid level. ____________________________________________   FINAL CLINICAL IMPRESSION(S) / ED DIAGNOSES  Final diagnoses:  Aspiration pneumonia of left lower lobe, unspecified aspiration pneumonia type (HCC)  Sepsis, due to unspecified organism [facility](HCC)      Sharyn CreamerMark Demetrio Leighty, MD 07/04/15 1009

## 2015-07-04 NOTE — NC FL2 (Signed)
[place] LEVEL OF CARE SCREENING TOOL     IDENTIFICATION  Patient Name: Leslie Savage Birthdate: July 25, 1928 Sex: female Admission Date (Current Location): 07/04/2015  [facility] and [place] Number:     Facility and Address:  [facility], [address], [place], [place] [address]      Provider Number: [id number]  Attending Physician Name and Address:  Hillary Bow, MD  Relative Name and Phone Number:       Current Level of Care: Hospital Recommended Level of Care: [place] Prior Approval Number:    Date Approved/Denied:   PASRR Number:    Discharge Plan: SNF    Current Diagnoses: Patient Active Problem List   Diagnosis Date Noted  . Aspiration pneumonia ([place]) 07/04/2015  . Acute respiratory failure (HCC) 07/04/2015    Orientation ACTIVITIES/SOCIAL BLADDER RESPIRATION            (3 liters O2)  BEHAVIORAL SYMPTOMS/MOOD NEUROLOGICAL BOWEL NUTRITION STATUS   (none)     Diet (npo)  PHYSICIAN VISITS COMMUNICATION OF NEEDS Height & Weight Skin  30 days Verbally   143 lbs.            AMBULATORY STATUS RESPIRATION       (3 liters O2)      Personal Care Assistance Level of Assistance               Functional Limitations Info                SPECIAL CARE FACTORS FREQUENCY                      Additional Factors Info  Code Status Code Status Info: DNR             Current Medications (07/04/2015): Current Facility-Administered Medications  Medication Dose Route Frequency Provider Last Rate Last Dose  . 0.9 %  sodium chloride infusion   Intravenous Continuous Idelle Crouch, MD 75 mL/hr at 07/04/15 1408    . acetaminophen (TYLENOL) tablet 650 mg  650 mg Oral Q6H PRN Idelle Crouch, MD       Or  . acetaminophen (TYLENOL) suppository 650 mg  650 mg Rectal Q6H PRN Idelle Crouch, MD      . artificial tears (LACRILUBE) ophthalmic ointment 1 application  1 application Both Eyes QHS  Idelle Crouch, MD      . aspirin EC tablet 81 mg  81 mg Oral Daily Idelle Crouch, MD      . bisacodyl (DULCOLAX) suppository 10 mg  10 mg Rectal Daily PRN Idelle Crouch, MD      . carvedilol (COREG) tablet 12.5 mg  12.5 mg Oral BID Idelle Crouch, MD      . divalproex (DEPAKOTE) DR tablet 125 mg  125 mg Oral 3 times per day Idelle Crouch, MD      . docusate sodium (COLACE) capsule 100 mg  100 mg Oral Q12H PRN Idelle Crouch, MD      . DULoxetine (CYMBALTA) DR capsule 20 mg  20 mg Oral Daily Idelle Crouch, MD      . DULoxetine (CYMBALTA) DR capsule 30 mg  30 mg Oral Daily Idelle Crouch, MD      . ferrous sulfate tablet 325 mg  325 mg Oral Daily Idelle Crouch, MD      . fluPHENAZine (PROLIXIN) tablet 2.5 mg  2.5 mg Oral Daily Idelle Crouch,  MD      . heparin injection 5,000 Units  5,000 Units Subcutaneous 3 times per day Idelle Crouch, MD      . insulin aspart (novoLOG) injection 0-9 Units  0-9 Units Subcutaneous 6 times per day Idelle Crouch, MD      . ipratropium-albuterol (DUONEB) 0.5-2.5 (3) MG/3ML nebulizer solution 3 mL  3 mL Nebulization QID Idelle Crouch, MD   3 mL at 07/04/15 1242  . [START ON 07/05/2015] levothyroxine (SYNTHROID, LEVOTHROID) tablet 125 mcg  125 mcg Oral QAC breakfast Idelle Crouch, MD      . lisinopril (PRINIVIL,ZESTRIL) tablet 20 mg  20 mg Oral Daily Idelle Crouch, MD      . loratadine (CLARITIN) tablet 10 mg  10 mg Oral Daily PRN Idelle Crouch, MD      . magnesium oxide (MAG-OX) tablet 400 mg  400 mg Oral BID Idelle Crouch, MD      . pioglitazone (ACTOS) tablet 15 mg  15 mg Oral BID Vena Rua, RPH       And  . metFORMIN (GLUCOPHAGE) tablet 500 mg  500 mg Oral BID Vena Rua, RPH      . methylPREDNISolone sodium succinate (SOLU-MEDROL) 125 mg/2 mL injection 60 mg  60 mg Intravenous Q12H Idelle Crouch, MD      . morphine 2 MG/ML injection 2 mg  2 mg Intravenous Q4H PRN Idelle Crouch, MD      .  ondansetron [facility]) tablet 4 mg  4 mg Oral Q6H PRN Idelle Crouch, MD       Or  . ondansetron [facility]) injection 4 mg  4 mg Intravenous Q6H PRN Idelle Crouch, MD      . pantoprazole (PROTONIX) EC tablet 40 mg  40 mg Oral BID Idelle Crouch, MD      . piperacillin-tazobactam (ZOSYN) IVPB 3.375 g  3.375 g Intravenous 3 times per day Crystal G Scarpena, RPH      . sodium chloride 0.9 % injection 3 mL  3 mL Intravenous Q12H Idelle Crouch, MD      . vancomycin (VANCOCIN) IVPB 750 mg/150 ml premix  750 mg Intravenous Q24H Idelle Crouch, MD       Do not use this list as official medication orders. Please verify with discharge summary.  Discharge Medications:   Medication List    ASK your doctor about these medications        acetaminophen 325 MG tablet  Commonly known as:  TYLENOL  Take 650 mg by mouth every 4 (four) hours as needed for mild pain, moderate pain or fever.     artificial tears Oint ophthalmic ointment  Place 1 application into both eyes at bedtime.     calcium-vitamin D 500-200 MG-UNIT tablet  Commonly known as:  OSCAL WITH D  Take 1 tablet by mouth 2 (two) times daily.     carvedilol 12.5 MG tablet  Commonly known as:  COREG  Take 12.5 mg by mouth 2 (two) times daily.     divalproex 125 MG DR tablet  Commonly known as:  DEPAKOTE  Take 125 mg by mouth every 8 (eight) hours.     docusate sodium 100 MG capsule  Commonly known as:  COLACE  Take 100 mg by mouth every 12 (twelve) hours as needed (for stool softener).     DULoxetine 30 MG capsule  Commonly known as:  CYMBALTA  Take 30 mg by  mouth daily. Take along with 20 mg capsule to equal 50 mg total.     DULoxetine 20 MG capsule  Commonly known as:  CYMBALTA  Take 20 mg by mouth daily. Take along with 30 mg capsule to equal 50 mg total.     ferrous sulfate 325 (65 FE) MG tablet  Take 325 mg by mouth daily.     fluPHENAZine 2.5 MG tablet  Commonly known as:  PROLIXIN  Take 2.5 mg by mouth daily.      fluPHENAZine decanoate 25 MG/ML injection  Commonly known as:  PROLIXIN  Inject 37.5 mg into the muscle every 14 (fourteen) days.     levothyroxine 125 MCG tablet  Commonly known as:  SYNTHROID, LEVOTHROID  Take 125 mcg by mouth daily before breakfast.     lisinopril 20 MG tablet  Commonly known as:  PRINIVIL,ZESTRIL  Take 20 mg by mouth daily.     loratadine 10 MG tablet  Commonly known as:  CLARITIN  Take 10 mg by mouth daily as needed for allergies.     magnesium hydroxide 400 MG/5ML suspension  Commonly known as:  MILK OF MAGNESIA  Take 30 mLs by mouth at bedtime as needed for mild constipation or moderate constipation.     MAGOX 400 400 (241.3 MG) MG tablet  Generic drug:  magnesium oxide  Take 400 mg by mouth 2 (two) times daily.     omeprazole 20 MG tablet  Commonly known as:  PRILOSEC OTC  Take 20 mg by mouth 2 (two) times daily.     pioglitazone-metformin 15-500 MG tablet  Commonly known as:  ACTOPLUS MET  Take 1 tablet by mouth 2 (two) times daily.        Relevant Imaging Results:  Relevant Lab Results:  Recent Labs    Additional Information    Shela Leff, LCSW

## 2015-07-04 NOTE — H&P (Signed)
History and Physical    Leslie Savage GNF:[id number] DOB: Jun 28, 1928 DOA: 07/04/2015  Referring physician: Dr. Jacqualine Code PCP: No primary care provider on file.  Specialists:   Chief Complaint: respiratory distress  HPI: Leslie Savage is a 81 y.o. female has a past medical history significant for HTN, DM, CKD, and bipolar D/O now with acute respiratory failure due to presumed aspiration pneumonia. Pt is from SNF and is DNR. Not coherent in ER. Son is present. In ER, pt noted to be hypoxic, febrile, tachycardic with a leukocytosis. She is now admitted for further evaluation.  Review of Systems: The patient denies anorexia, fever, weight loss,, vision loss, decreased hearing, hoarseness, chest pain, syncope, dyspnea on exertion, peripheral edema, balance deficits, hemoptysis, abdominal pain, melena, hematochezia, severe indigestion/heartburn, hematuria, incontinence, genital sores, muscle weakness, suspicious skin lesions, transient blindness, difficulty walking, depression, unusual weight change, abnormal bleeding, enlarged lymph nodes, angioedema, and breast masses.   Past Medical History  Diagnosis Date  . Bipolar 1 disorder ([place])   . Kidney disease   . Hypothyroidism vitamin b12  . Anemia   . Hyperlipidemia   . MDD (major depressive disorder) ([place])   . TIA (transient ischemic attack)   . Cerebral infarction ([place])   . Schizophrenia ([place])   . Osteoporosis   . Peripheral vascular disease [facility])    Past Surgical History  Procedure Laterality Date  . Joint replacement     Social History:  reports that she has never smoked. She does not have any smokeless tobacco history on file. Her alcohol and drug histories are not on file.  Allergies  Allergen Reactions  . Ciprocin-Fluocin-Procin [Fluocinolone]   . Risperdal [Risperidone]   . Rosuvastatin     History reviewed. No pertinent family history.  Prior to Admission medications   Medication Sig Start Date End Date Taking?  Authorizing Provider  acetaminophen (TYLENOL) 325 MG tablet Take 650 mg by mouth every 4 (four) hours as needed for mild pain, moderate pain or fever.   Yes Historical Provider, MD  artificial tears (LACRILUBE) OINT ophthalmic ointment Place 1 application into both eyes at bedtime.   Yes Historical Provider, MD  calcium-vitamin D (OSCAL WITH D) 500-200 MG-UNIT tablet Take 1 tablet by mouth 2 (two) times daily.   Yes Historical Provider, MD  carvedilol (COREG) 12.5 MG tablet Take 12.5 mg by mouth 2 (two) times daily.   Yes Historical Provider, MD  divalproex (DEPAKOTE) 125 MG DR tablet Take 125 mg by mouth every 8 (eight) hours.   Yes Historical Provider, MD  docusate sodium (COLACE) 100 MG capsule Take 100 mg by mouth every 12 (twelve) hours as needed (for stool softener).   Yes Historical Provider, MD  ferrous sulfate 325 (65 FE) MG tablet Take 325 mg by mouth daily.   Yes Historical Provider, MD  fluPHENAZine (PROLIXIN) 2.5 MG tablet Take 2.5 mg by mouth daily.   Yes Historical Provider, MD  fluPHENAZine decanoate (PROLIXIN) 25 MG/ML injection Inject 37.5 mg into the muscle every 14 (fourteen) days.   Yes Historical Provider, MD  levothyroxine (SYNTHROID, LEVOTHROID) 125 MCG tablet Take 125 mcg by mouth daily before breakfast.   Yes Historical Provider, MD  lisinopril (PRINIVIL,ZESTRIL) 20 MG tablet Take 20 mg by mouth daily.   Yes Historical Provider, MD  loratadine (CLARITIN) 10 MG tablet Take 10 mg by mouth daily as needed for allergies.   Yes Historical Provider, MD  magnesium hydroxide (MILK OF MAGNESIA) 400 MG/5ML suspension Take 30 mLs by  mouth at bedtime as needed for mild constipation or moderate constipation.   Yes Historical Provider, MD  magnesium oxide (MAGOX 400) 400 (241.3 MG) MG tablet Take 400 mg by mouth 2 (two) times daily.   Yes Historical Provider, MD  omeprazole (PRILOSEC OTC) 20 MG tablet Take 20 mg by mouth 2 (two) times daily.   Yes Historical Provider, MD   pioglitazone-metformin (ACTOPLUS MET) 15-500 MG tablet Take 1 tablet by mouth 2 (two) times daily.   Yes Historical Provider, MD   Physical Exam: Filed Vitals:   07/04/15 0853 07/04/15 0924 07/04/15 0943  BP: 149/87    Pulse: 141    Temp:   100.4 F (38 C)  TempSrc:   Rectal  Height:  5' 7"  (1.702 m)   Weight:  64.864 kg (143 lb)   SpO2: 94%       General: mild respiratory distress  Eyes: PERRL, EOMI, no scleral icterus  ENT: moist oropharynx  Neck: supple, no lymphadenopathy  Cardiovascular: rapid rate without MRG; 2+ peripheral pulses, no JVD, no peripheral edema  Respiratory: diffuse rhonchi with decreased breath sounds at left base  Abdomen: soft, non tender to palpation, positive bowel sounds, no guarding, no rebound  Skin: no rashes  Musculoskeletal: normal bulk and tone, no joint swelling  Psychiatric: normal mood and affect  Neurologic: CN 2-12 grossly intact, MS 5/5 in all 4  Labs on Admission:  Basic Metabolic Panel:  Recent Labs Lab 07/04/15 0911  NA 136  K 4.5  CL 101  CO2 24  GLUCOSE 369*  BUN 27*  CREATININE 1.24*  CALCIUM 9.8   Liver Function Tests:  Recent Labs Lab 07/04/15 0911  AST 19  ALT 11*  ALKPHOS 63  BILITOT 1.4*  PROT 7.7  ALBUMIN 3.9   No results for input(s): LIPASE, AMYLASE in the last 168 hours. No results for input(s): AMMONIA in the last 168 hours. CBC:  Recent Labs Lab 07/04/15 0911  WBC 18.2*  NEUTROABS 15.0*  HGB 13.5  HCT 41.0  MCV 92.0  PLT 253   Cardiac Enzymes:  Recent Labs Lab 07/04/15 0911  TROPONINI 0.05*    BNP (last 3 results)  Recent Labs  07/04/15 0911  BNP 123.0*    ProBNP (last 3 results) No results for input(s): PROBNP in the last 8760 hours.  CBG: No results for input(s): GLUCAP in the last 168 hours.  Radiological Exams on Admission: Dg Chest Port 1 View  07/04/2015  CLINICAL DATA:  Low O2 sats. Aspiration last night per nursing home. EXAM: PORTABLE CHEST 1 VIEW  COMPARISON:  Chest x-ray dated 06/15/2014. FINDINGS: Patchy airspace opacities at the left lung base may be related to the given history of aspiration. Alternatively atelectasis. The mild interstitial coarsening is stable compared to multiple prior studies, presumably mild chronic fibrosis. Heart size is upper normal. Overall cardiomediastinal silhouette is stable in size and configuration. Atherosclerotic calcifications again noted at the aortic knob. No large pleural effusion. No pneumothorax. No acute osseous abnormality. IMPRESSION: Patchy airspace opacities at the left lung base which may be related to the history of recent aspiration. Pneumonia or atelectasis are also possibilities. Chronic interstitial thickening bilaterally suggesting some degree of interstitial fibrosis. Electronically Signed   By: Franki Cabot M.D.   On: 07/04/2015 09:32    EKG: Independently reviewed.  Assessment/Plan Active Problems:   Aspiration pneumonia ([place])   Acute respiratory failure ([place])   Will admit to floor as DNR with O2, SVN's, IV ABX, and IV  steroids. Follow sugars. NPO for now. ST consult. Repeat labs and CXR in AM.  Diet: NPO Fluids: NS@75  DVT Prophylaxis: SQ Heparin  Code Status: DNR  Family Communication: yes  Disposition Plan: SNF  Time spent: 55 min

## 2015-07-05 ENCOUNTER — Inpatient Hospital Stay: Payer: Medicare Other

## 2015-07-05 LAB — GLUCOSE, CAPILLARY
GLUCOSE-CAPILLARY: 241 mg/dL — AB (ref 65–99)
GLUCOSE-CAPILLARY: 202 mg/dL — AB (ref 65–99)
GLUCOSE-CAPILLARY: 223 mg/dL — AB (ref 65–99)
GLUCOSE-CAPILLARY: 233 mg/dL — AB (ref 65–99)
Glucose-Capillary: 202 mg/dL — ABNORMAL HIGH (ref 65–99)
Glucose-Capillary: 226 mg/dL — ABNORMAL HIGH (ref 65–99)

## 2015-07-05 LAB — TROPONIN I: TROPONIN I: 0.05 ng/mL — AB (ref <0.031)

## 2015-07-05 LAB — CBC
HCT: 28.3 % — ABNORMAL LOW (ref 35.0–47.0)
HEMOGLOBIN: 9.8 g/dL — AB (ref 12.0–16.0)
MCH: 31.6 pg (ref 26.0–34.0)
MCHC: 34.7 g/dL (ref 32.0–36.0)
MCV: 91.1 fL (ref 80.0–100.0)
Platelets: 173 10*3/uL (ref 150–440)
RBC: 3.11 MIL/uL — ABNORMAL LOW (ref 3.80–5.20)
RDW: 13.7 % (ref 11.5–14.5)
WBC: 11.8 10*3/uL — ABNORMAL HIGH (ref 3.6–11.0)

## 2015-07-05 LAB — BLOOD GAS, VENOUS
Acid-base deficit: 1.4 mmol/L (ref 0.0–2.0)
BICARBONATE: 22.8 meq/L (ref 21.0–28.0)
O2 Saturation: 83 %
PCO2 VEN: 36 mmHg — AB (ref 44.0–60.0)
PO2 VEN: 47 mmHg — AB (ref 30.0–45.0)
Patient temperature: 37
pH, Ven: 7.41 (ref 7.320–7.430)

## 2015-07-05 LAB — BASIC METABOLIC PANEL
ANION GAP: 5 (ref 5–15)
BUN: 34 mg/dL — AB (ref 6–20)
CALCIUM: 8.5 mg/dL — AB (ref 8.9–10.3)
CO2: 24 mmol/L (ref 22–32)
Chloride: 110 mmol/L (ref 101–111)
Creatinine, Ser: 1.34 mg/dL — ABNORMAL HIGH (ref 0.44–1.00)
GFR calc Af Amer: 41 mL/min — ABNORMAL LOW (ref >60)
GFR calc non Af Amer: 35 mL/min — ABNORMAL LOW (ref >60)
GLUCOSE: 191 mg/dL — AB (ref 65–99)
Potassium: 4.2 mmol/L (ref 3.5–5.1)
Sodium: 139 mmol/L (ref 135–145)

## 2015-07-05 MED ORDER — INSULIN GLARGINE 100 UNIT/ML ~~LOC~~ SOLN
15.0000 [IU] | Freq: Every day | SUBCUTANEOUS | Status: DC
  Administered 2015-07-05 – 2015-07-07 (×3): 15 [IU] via SUBCUTANEOUS
  Filled 2015-07-05 (×3): qty 0.15

## 2015-07-05 NOTE — Plan of Care (Signed)
Problem: Education: Goal: Knowledge of [place] [facility] Education information/materials will improve Outcome: Progressing  Patient stated that she has never used insulin before.  Patient educated on elevated blood sugars due to receiving breathing treatment, IV antibiotics and steroids.  Problem: Safety: Goal: Ability to remain free from injury will improve Outcome: Progressing Patient currently on bedrest.  She has generalized weakness and plantar and dorsiflexion are weak.  Patient on high fall precautions.  Patient educated about staying in bed and calling for assistance.  Patient does not use call bell but will yell for assistance.  Bed in lowest position with wheels locked and call bell within reach.  Bed alarm on throughout shift. Patient free from injury.  Problem: Health Behavior/Discharge Planning: Goal: Ability to manage health-related needs will improve Outcome: Progressing Patient continues with perseveration this shift.  She is more alert and oriented and interacting with staff.   Plan for swallow evaluation today and labs and chest x-rays to be repeated.  Patient continually asking for food and water this shift.

## 2015-07-05 NOTE — Care Management (Signed)
Admitted to [facility] with the diagnosis of pneumonia. A resident of [facility] since 06/19/14. Son is Onalee HuaDavid [phone]). Dr. Audree BanePeter King is listed as primary care physician. Gwenette GreetBrenda S Bristal Steffy RN MSN CCM Care management [phone]

## 2015-07-05 NOTE — Progress Notes (Signed)
[facility] [place] at [facility]   PATIENT NAME: Leslie NyVivian Savage    MR#:  [id number]  DATE OF BIRTH:  03/06/1928  SUBJECTIVE: 81 year old female patient admitted for aspiration pneumonia. Patient is confused today. Afebrile today.  [facility].                                         CHIEF COMPLAINT:   Chief Complaint  Patient presents with  . Aspiration    REVIEW OF SYSTEMS:   Review of Systems  Unable to perform ROS: acuity of condition   CONSTITUTIONAL: No fever, fatigue or weakness.  EYES: No blurred or double vision.  EARS, NOSE, AND THROAT: No tinnitus or ear pain.  RESPIRATORY: No cough, shortness of breath, wheezing or hemoptysis.  CARDIOVASCULAR: No chest pain, orthopnea, edema.  GASTROINTESTINAL: No nausea, vomiting, diarrhea or abdominal pain.  GENITOURINARY: No dysuria, hematuria.  ENDOCRINE: No polyuria, nocturia,  HEMATOLOGY: No anemia, easy bruising or bleeding SKIN: No rash or lesion. MUSCULOSKELETAL: No joint pain or arthritis.   NEUROLOGIC: No tingling, numbness, weakness.  PSYCHIATRY: No anxiety or depression.   DRUG ALLERGIES:   Allergies  Allergen Reactions  . Ciprocin-Fluocin-Procin [Fluocinolone] Other (See Comments)    Unknown reaction  . Risperdal [Risperidone] Other (See Comments)    Unknown reaction  . Rosuvastatin Other (See Comments)    Unknown reaction    VITALS:  Blood pressure 132/82, pulse 86, temperature 97.9 F (36.6 C), temperature source Oral, resp. rate 20, height 5\' 7"  (1.702 m), weight 82.237 kg (181 lb 4.8 oz), SpO2 98 %.  PHYSICAL EXAMINATION:  GENERAL:  81 y.o.-year-old patient lying in the bed with no acute distress.  EYES: Pupils equal, round, reactive to light and accommodation. No scleral icterus. Extraocular muscles intact.  HEENT: Head atraumatic, normocephalic. Oropharynx and nasopharynx clear.  NECK:  Supple, no jugular venous distention. No thyroid enlargement, no  tenderness.  LUNGS: Normal breath sounds bilaterally, no wheezing, rales,rhonchi or crepitation. No use of accessory muscles of respiration.  CARDIOVASCULAR: S1, S2 normal. No murmurs, rubs, or gallops.  ABDOMEN: Soft, nontender, nondistended. Bowel sounds present. No organomegaly or mass.  EXTREMITIES: Bilateral pitting edema. NEUROLOGIC: Cranial nerves II through XII are intact. Muscle strength 5/5 in all extremities. Sensation intact. Gait not checked.  PSYCHIATRIC: TConfused SKIN: No obvious rash, lesion, or ulcer.    LABORATORY PANEL:   CBC  Recent Labs Lab 07/04/15 2352  WBC 11.8*  HGB 9.8*  HCT 28.3*  PLT 173   ------------------------------------------------------------------------------------------------------------------  Chemistries   Recent Labs Lab 07/04/15 0911 07/04/15 2352  NA 136 139  K 4.5 4.2  CL 101 110  CO2 24 24  GLUCOSE 369* 191*  BUN 27* 34*  CREATININE 1.24* 1.34*  CALCIUM 9.8 8.5*  AST 19  --   ALT 11*  --   ALKPHOS 63  --   BILITOT 1.4*  --    ------------------------------------------------------------------------------------------------------------------  Cardiac Enzymes  Recent Labs Lab 07/04/15 2352  TROPONINI 0.05*   ------------------------------------------------------------------------------------------------------------------  RADIOLOGY:  Portable Chest 1 View  07/05/2015  CLINICAL DATA:  Aspiration pneumonia, acute respiratory failure EXAM: PORTABLE CHEST 1 VIEW COMPARISON:  Portable chest x-ray of July 04, 2015 FINDINGS: The lungs are adequately inflated. The interstitial markings remain mildly increased. The left retrocardiac region exhibits patchy density. There is no large pleural effusion. There is no  pneumothorax. The heart and is top-normal in size. The central pulmonary vascularity remains engorged. The bony thorax exhibits no acute abnormality. There are old rib deformities bilaterally. IMPRESSION: Fairly  stable appearance of the chest since yesterday's study. There is minimal left basilar subsegmental atelectasis which may reflect recent aspiration. Mild central pulmonary vascular congestion persists as well. Electronically Signed   By: David  [place] M.D.   On: 07/05/2015 07:45   Dg Chest Port 1 View  07/04/2015  CLINICAL DATA:  Low O2 sats. Aspiration last night per nursing home. EXAM: PORTABLE CHEST 1 VIEW COMPARISON:  Chest x-ray dated 06/15/2014. FINDINGS: Patchy airspace opacities at the left lung base may be related to the given history of aspiration. Alternatively atelectasis. The mild interstitial coarsening is stable compared to multiple prior studies, presumably mild chronic fibrosis. Heart size is upper normal. Overall cardiomediastinal silhouette is stable in size and configuration. Atherosclerotic calcifications again noted at the aortic knob. No large pleural effusion. No pneumothorax. No acute osseous abnormality. IMPRESSION: Patchy airspace opacities at the left lung base which may be related to the history of recent aspiration. Pneumonia or atelectasis are also possibilities. Chronic interstitial thickening bilaterally suggesting some degree of interstitial fibrosis. Electronically Signed   By: Bary Richard M.D.   On: 07/04/2015 09:32    EKG:   Orders placed or performed during the hospital encounter of 07/04/15  . EKG 12-Lead  . EKG 12-Lead  . ED EKG 12-Lead  . ED EKG 12-Lead    ASSESSMENT AND PLAN:   *1 aspiration pneumonia: Patient started on IV vancomycin, Zosyn, Solu-Medrol. 2, speech therapy evaluation today. #2. Acute respiratory failure hypoxia due to pneumonia and a new oxygen, follow blood cultures, continue IV antibiotics.   #3 diabetes mellitus type 2 ; seen by diabetic nurse, will restart Lantus 15 units daily and the insulin coverage. Check hemoglobin A1c as well. Hold Actos and also metformin today. As she is nothing by mouth.   #4 .sepsis with lactic acidosis  secondary to  Pneumonia;continue IV abx,iv fluids 5, history of bipolar disorder continue home medication with Depakote, Prolixin, Cymbalta History of hypertension controlled; continue Coreg. Malis in no pedal. History of chronic kidney disease stage III; stable   All the records are reviewed and case discussed with Care Management/Social Workerr. Management plans discussed with the patient, family and they are in agreement.  CODE STATUS: DNR  TOTAL TIME TAKING CARE OF THIS PATIENT: 35 minutes.   POSSIBLE D/C IN 1-2 DAYS, DEPENDING ON CLINICAL CONDITION.   Katha Hamming M.D on 07/05/2015 at 10:37 AM  Between 7am to 6pm - Pager - [phone]  After 6pm go to [email or web address] - password [email or web address] [facility]  [facility]  Office  [phone]  CC: Primary care physician; No primary care provider on file.   Note: This dictation was prepared with Dragon dictation along with smaller phrase technology. Any transcriptional errors that result from this process are unintentional.

## 2015-07-05 NOTE — Progress Notes (Signed)
Inpatient Diabetes Program Recommendations  AACE/ADA: New Consensus Statement on Inpatient Glycemic Control (2015)  Target Ranges:  Prepandial:   less than 140 mg/dL      Peak postprandial:   less than 180 mg/dL (1-2 hours)      Critically ill patients:  140 - 180 mg/dL  Results for Leslie Savage, Leslie Savage (MRN [id number]) as of 07/05/2015 08:38  Ref. Range 07/04/2015 12:58 07/04/2015 16:14 07/04/2015 19:52 07/04/2015 23:58 07/05/2015 04:20 07/05/2015 07:46  Glucose-Capillary Latest Ref Range: 65-99 mg/dL 380 (H) 320 (H) 260 (H) 171 (H) 241 (H) 202 (H)  Review of Glycemic Control  Diabetes history: DM2 Outpatient Diabetes medications: Actoplus Met 15-500 mg BID Current orders for Inpatient glycemic control: Actos 15 mg BID, Metformin 500 mg BID, Novolog 0-9 units Q4H  Inpatient Diabetes Program Recommendations: Insulin - Basal: If steroids are continued as ordered (Solumedrol 60 mg Q12H), please consider ordering Lantus 15 units Q24H starting now. Please note that if basal insulin is ordered as requested, once steroid are tapered basal insulin may need to be decreased and/or discontinued. Oral Agents: Please consider discontinuing oral DM medications while inpatient. HgbA1C: Noted last A1C in Care Everywhere tab was 7.9% on 06/02/2014. Please order an A1C to evaluate glycemic control over the past 2-3 months.  Note: Patient has been NPO since being admitted to the hospital and is awaiting a swallow evaluation. Patient has continued to receive Actos and Metformin despite being NPO and glucose has ranged from 171-380 mg/dl over the past 20 hours. Patient is ordered steroids which is likely contributing to hyperglycemia.   Thanks, Barnie Alderman, RN, MSN, CDE Diabetes Coordinator Inpatient Diabetes Program [phone] (Team Pager from [place] to [place]) [phone] ([facility] office) [phone] [facility] office) [phone] [facility] office)

## 2015-07-05 NOTE — Plan of Care (Signed)
Problem: Education: Goal: Knowledge of  General Education information/materials will improve Outcome: Progressing Pt very lethargic today.  Continues on IV abx and IV steroids for aspiration pna.  Blood sugars elevated b/c of steroids and breathing tx.  A1C to be tested today. Pt had elevated lactic acidosis secondary to pna - fluids also continued.   Problem: Safety: Goal: Ability to remain free from injury will improve Outcome: Progressing Pt remained free from injury during shift.  Bed alarm on and hourly rounding performed.   Problem: Pain Managment: Goal: General experience of comfort will improve Outcome: Not Progressing Pt showed no s/sx of discomfort. Diet advanced to puree w/thin liquids after swallow eval.  Having ice chips/water was high priority.   Problem: Skin Integrity: Goal: Risk for impaired skin integrity will decrease Outcome: Progressing Pt had hourly rounding for checking incontinence status and turning.  Pink foam dressing on sacrum.

## 2015-07-05 NOTE — Evaluation (Signed)
Clinical/Bedside Swallow Evaluation Patient Details  Name: Leslie Savage MRN: [id number] Date of Birth: 12/22/1927  Today's Date: 07/05/2015 Time: SLP Start Time (ACUTE ONLY): 0930 SLP Stop Time (ACUTE ONLY): 1000 SLP Time Calculation (min) (ACUTE ONLY): 30 min  Past Medical History:  Past Medical History  Diagnosis Date  . Bipolar 1 disorder (HCC)   . Kidney disease   . Hypothyroidism vitamin b12  . Anemia   . Hyperlipidemia   . MDD (major depressive disorder) (HCC)   . TIA (transient ischemic attack)   . Cerebral infarction (HCC)   . Schizophrenia (HCC)   . Osteoporosis   . Peripheral vascular disease [facility](HCC)    Past Surgical History:  Past Surgical History  Procedure Laterality Date  . Joint replacement     HPI:  Leslie Savage is a 81 y.o. female has a past medical history significant for HTN, DM, CKD, and bipolar D/O now with acute respiratory failure due to presumed aspiration pneumonia. Pt is from SNF and is DNR. Not coherent in ER. Son is present. In ER, pt noted to be hypoxic, febrile, tachycardic with a leukocytosis. She is now admitted for further evaluation.   Assessment / Plan / Recommendation Clinical Impression  Pt presented with baseline confusion and has a known history of dementia. Pt's current cognitive status puts her at an increased risk of aspiration. Pt consumed 3 ice chips, 6+ ounces thin liquid, and 7 trials of puree with no immediate or overt s/s of aspiration.  No oral pharyngeal deficits noted during this evaluation. Pt would benefit from a puree diet with thin liquids.  Pt requires hand over hand assistance in consuming thin liquids with a straw. Feeder should keep fingers around straw and pinch to control volume if impulsivity is observed. Pt consumed multiple sips with ST, however please utilize strict aspiration precautions at this time.  To ensure cognitive awareness to task, Pt should hold the cup herself (this increases safety in consuming thin  liquids and prepares Pt for the task -- she will need assistance in holding the cup, again - hand over hand). Meds to be provided crushed in puree. Use a liquid washdown after med administration. NSG and MD updated on findings and agree with current plan.    Aspiration Risk  Mild aspiration risk (based on cognitive status)    Diet Recommendation   Puree with thin liquids (straw okay, must have feeder help Pt hold cup and take sips -- keep fingers on straw to pinch/control volume if impulsivity noted)  Medication Administration: Crushed with puree    Other  Recommendations Oral Care Recommendations: Oral care BID;Staff/trained caregiver to provide oral care   Follow up Recommendations   (TBD)    Frequency and Duration min 3x week  1 week       Swallow Study   General Date of Onset: 07/04/15 HPI: Leslie Savage is a 81 y.o. female has a past medical history significant for HTN, DM, CKD, and bipolar D/O now with acute respiratory failure due to presumed aspiration pneumonia. Pt is from SNF and is DNR. Not coherent in ER. Son is present. In ER, pt noted to be hypoxic, febrile, tachycardic with a leukocytosis. She is now admitted for further evaluation. Type of Study: Bedside Swallow Evaluation Diet Prior to this Study:  (unknown ) Temperature Spikes Noted: Yes (100.4 ;  WBC 11.8) Respiratory Status: Nasal cannula (3 liters) History of Recent Intubation: No Behavior/Cognition: Alert;Confused;Agitated;Impulsive;Requires cueing Oral Cavity Assessment:  (could not assess)  Oral Care Completed by SLP: No Oral Cavity - Dentition: Missing dentition Self-Feeding Abilities: Total assist;Needs set up (hand over hand with cup and straw for thin liquids) Patient Positioning: Upright in bed Baseline Vocal Quality: Normal (mumbled speech) Volitional Cough: Cognitively unable to elicit Volitional Swallow: Able to elicit    Oral/Motor/Sensory Function Overall Oral Motor/Sensory Function: Within  functional limits   Ice Chips Ice chips: Within functional limits Presentation: Spoon Other Comments: Pt took 3 trials of ice chips with no immediate or overt s/s of aspiration   Thin Liquid Thin Liquid: Within functional limits Presentation: Straw (with max assist (hand over hand)) Other Comments: Pt consumed 6+ ounces of water with no immediate or overt s/s of aspiration.    Nectar Thick Nectar Thick Liquid: Not tested   Honey Thick Honey Thick Liquid: Not tested   Puree Puree: Within functional limits Presentation: Spoon Other Comments: Pt consumed 7 bites of puree with no immediate or overt s/s of aspiration.   Solid Solid: Not tested       Arminda Foglio 07/05/2015,10:30 AM

## 2015-07-05 NOTE — Care Management Important Message (Signed)
Important Message  Patient Details  Name: Leslie Savage MRN: [id number] Date of Birth: 11/29/1927   Medicare Important Message Given:  Yes    Gwenette GreetBrenda S Mary-Ann Pennella, RN 07/05/2015, 11:55 AM

## 2015-07-06 LAB — BASIC METABOLIC PANEL
ANION GAP: 6 (ref 5–15)
BUN: 38 mg/dL — ABNORMAL HIGH (ref 6–20)
CALCIUM: 8.2 mg/dL — AB (ref 8.9–10.3)
CO2: 24 mmol/L (ref 22–32)
Chloride: 105 mmol/L (ref 101–111)
Creatinine, Ser: 1.14 mg/dL — ABNORMAL HIGH (ref 0.44–1.00)
GFR, EST AFRICAN AMERICAN: 49 mL/min — AB (ref >60)
GFR, EST NON AFRICAN AMERICAN: 43 mL/min — AB (ref >60)
GLUCOSE: 193 mg/dL — AB (ref 65–99)
POTASSIUM: 3.7 mmol/L (ref 3.5–5.1)
Sodium: 135 mmol/L (ref 135–145)

## 2015-07-06 LAB — GLUCOSE, CAPILLARY
GLUCOSE-CAPILLARY: 160 mg/dL — AB (ref 65–99)
GLUCOSE-CAPILLARY: 163 mg/dL — AB (ref 65–99)
GLUCOSE-CAPILLARY: 145 mg/dL — AB (ref 65–99)
Glucose-Capillary: 154 mg/dL — ABNORMAL HIGH (ref 65–99)
Glucose-Capillary: 173 mg/dL — ABNORMAL HIGH (ref 65–99)
Glucose-Capillary: 208 mg/dL — ABNORMAL HIGH (ref 65–99)
Glucose-Capillary: 231 mg/dL — ABNORMAL HIGH (ref 65–99)

## 2015-07-06 LAB — HEMOGLOBIN A1C: Hgb A1c MFr Bld: 7.5 % — ABNORMAL HIGH (ref 4.0–6.0)

## 2015-07-06 MED ORDER — VANCOMYCIN HCL IN DEXTROSE 1-5 GM/200ML-% IV SOLN
1000.0000 mg | INTRAVENOUS | Status: DC
  Administered 2015-07-06 – 2015-07-07 (×2): 1000 mg via INTRAVENOUS
  Filled 2015-07-06 (×3): qty 200

## 2015-07-06 MED ORDER — ENSURE ENLIVE PO LIQD
237.0000 mL | Freq: Three times a day (TID) | ORAL | Status: DC
  Administered 2015-07-06 (×2): 237 mL via ORAL
  Administered 2015-07-08: 09:00:00 120 mL via ORAL

## 2015-07-06 NOTE — Plan of Care (Signed)
Problem: Health Behavior/Discharge Planning: Goal: Ability to manage health-related needs will improve Outcome: Adequate for Discharge Pt will more than likely be discharged to [facility].  She has been there since October.

## 2015-07-06 NOTE — Plan of Care (Signed)
Problem: Skin Integrity: Goal: Risk for impaired skin integrity will decrease Outcome: Progressing No skin impairity noted.  Pink foam to sacral area for prevention. Pt tilted minimally of every 4 hours.

## 2015-07-06 NOTE — Plan of Care (Signed)
Problem: Safety: Goal: Ability to remain free from injury will improve Outcome: Adequate for Discharge Pt will not be able to understand safety risk factors.  Pt is still a high fall risk.

## 2015-07-06 NOTE — Progress Notes (Signed)
Initial Nutrition Assessment   INTERVENTION:   Meals and Snacks: Cater to patient preferences; SLP following Medical Food Supplement Therapy: will recommend Ensure Enlive po TID, each supplement provides 350 kcal and 20 grams of protein   NUTRITION DIAGNOSIS:   Inadequate oral intake related to acute illness as evidenced by meal completion < 25%.  GOAL:   Patient will meet greater than or equal to 90% of their needs  MONITOR:    (Energy Intake, Digestive system, Pulmonary Profile, Electrolyte and renal Profile)  REASON FOR ASSESSMENT:   Diagnosis    ASSESSMENT:   Pt admitted with sepsis with lactic acid secondary to aspiration pna. Pt confused on visit this am.  Past Medical History  Diagnosis Date  . Bipolar 1 disorder (HCC)   . Kidney disease   . Hypothyroidism vitamin b12  . Anemia   . Hyperlipidemia   . MDD (major depressive disorder) (HCC)   . TIA (transient ischemic attack)   . Cerebral infarction (HCC)   . Schizophrenia (HCC)   . Osteoporosis   . Peripheral vascular disease (HCC)      Diet Order:  DIET - DYS 1 Room service appropriate?: Yes; Fluid consistency:: Thin    Current Nutrition: Pt did not eat breakfast this am per RN Lupita Leash. SLP going in to work with pt after rounds this am. SLP did report yesterday that pt drank a lot of water with SLP very well.   Food/Nutrition-Related History: Unable to clarify with pt on rounds.   Scheduled Medications:  . artificial tears  1 application Both Eyes QHS  . aspirin EC  81 mg Oral Daily  . carvedilol  12.5 mg Oral BID  . divalproex  125 mg Oral 3 times per day  . DULoxetine  20 mg Oral Daily  . DULoxetine  30 mg Oral Daily  . feeding supplement (ENSURE ENLIVE)  237 mL Oral TID WC  . ferrous sulfate  325 mg Oral Daily  . fluPHENAZine  2.5 mg Oral Daily  . heparin  5,000 Units Subcutaneous 3 times per day  . insulin aspart  0-9 Units Subcutaneous 6 times per day  . insulin glargine  15 Units  Subcutaneous Daily  . ipratropium-albuterol  3 mL Nebulization QID  . levothyroxine  125 mcg Oral QAC breakfast  . lisinopril  20 mg Oral Daily  . magnesium oxide  400 mg Oral BID  . methylPREDNISolone (SOLU-MEDROL) injection  60 mg Intravenous Q12H  . pantoprazole  40 mg Oral BID  . piperacillin-tazobactam (ZOSYN)  IV  3.375 g Intravenous 3 times per day  . sodium chloride  3 mL Intravenous Q12H  . vancomycin  1,000 mg Intravenous Q24H     Electrolyte/Renal Profile and Glucose Profile:   Recent Labs Lab 07/04/15 0911 07/04/15 2352 07/06/15 0813  NA 136 139 135  K 4.5 4.2 3.7  CL 101 110 105  CO2 BUN 27* 34* 38*  CREATININE 1.24* 1.34* 1.14*  CALCIUM 9.8 8.5* 8.2*  GLUCOSE 369* 191* 193*   Protein Profile:  Recent Labs Lab 07/04/15 0911  ALBUMIN 3.9    Gastrointestinal Profile: Last BM:  07/05/2015   Nutrition-Focused Physical Exam Findings:  Unable to complete Nutrition-Focused physical exam at this time.    Weight Change: Unable to clarify with pt    Skin:  Reviewed, no issues  Height:   Ht Readings from Last 1 Encounters:  07/04/15  (1.702 m)    Weight:   Wt Readings  from Last 1 Encounters:  07/06/15 183 lb 6.4 oz (83.19 kg)     BMI:  Body mass index is 28.72 kg/(m^2).  Estimated Nutritional Needs:   Kcal:  BEE: 1308kcals, TEE: (IF 1.1-1.3)(AF 1.2) 1726-2039kcals  Protein:  66-83g protein (0.8-1.0g/kg)  Fluid:  2075-242290mL of fluid (25-4630mL/kg)  EDUCATION NEEDS:   No education needs identified at this time   HIGH Care Level  Leda QuailAllyson Perris Conwell, RD, LDN Pager [phone]

## 2015-07-06 NOTE — Consult Note (Signed)
ANTIBIOTIC CONSULT NOTE - FOLLOW UP  Pharmacy Consult for vancomycin  Indication: pneumonia  Allergies  Allergen Reactions  . Ciprocin-Fluocin-Procin [Fluocinolone] Other (See Comments)    Unknown reaction  . Risperdal [Risperidone] Other (See Comments)    Unknown reaction  . Rosuvastatin Other (See Comments)    Unknown reaction    Patient Measurements: Height:  (170.2 cm) Weight: 183 lb 6.4 oz (83.19 kg) IBW/kg (Calculated) : 61.6 Adjusted Body Weight: 70kg  Vital Signs: Temp: 98.7 F (37.1 C) (11/15 0501) Temp Source: Oral (11/15 0501) BP: 160/67 mmHg (11/15 1052) Pulse Rate: 73 (11/15 1052) Intake/Output from previous day:   Intake/Output from this shift:    Labs:  Recent Labs  07/04/15 0911 07/04/15 2352 07/06/15 0813  WBC 18.2* 11.8*  --   HGB 13.5 9.8*  --   PLT 253 173  --   CREATININE 1.24* 1.34* 1.14*   Estimated Creatinine Clearance: 40 mL/min (by C-G formula based on Cr of 1.14). No results for input(s): VANCOTROUGH, VANCOPEAK, VANCORANDOM, GENTTROUGH, GENTPEAK, GENTRANDOM, TOBRATROUGH, TOBRAPEAK, TOBRARND, AMIKACINPEAK, AMIKACINTROU, AMIKACIN in the last 72 hours.   Microbiology: Recent Results (from the past 720 hour(s))  Blood Culture (routine x 2)     Status: None (Preliminary result)   Collection Time: 07/04/15  9:11 AM  Result Value Ref Range Status   Specimen Description BLOOD RIGHT AC  Final   Special Requests BOTTLES DRAWN AEROBIC AND ANAEROBIC  1CC  Final   Culture  Setup Time   Final    GRAM POSITIVE COCCI IN CLUSTERS AEROBIC BOTTLE ONLY CRITICAL RESULT CALLED TO, READ BACK BY AND VERIFIED WITHZettie Cooley AT 1610 07/05/15 CTJ    Culture   Final    COAGULASE NEGATIVE STAPHYLOCOCCUS AEROBIC BOTTLE ONLY Results consistent with contamination.    Report Status PENDING  Incomplete  Blood Culture (routine x 2)     Status: None (Preliminary result)   Collection Time: 07/04/15  9:11 AM  Result Value Ref Range Status   Specimen Description BLOOD LEFT HAND  Final   Special Requests BOTTLES DRAWN AEROBIC AND ANAEROBIC  1CC  Final   Culture NO GROWTH 2 DAYS  Final   Report Status PENDING  Incomplete  Urine culture     Status: None (Preliminary result)   Collection Time: 07/04/15  9:42 AM  Result Value Ref Range Status   Specimen Description URINE, RANDOM  Final   Special Requests NONE  Final   Culture HOLDING FOR POSSIBLE PATHOGEN  Final   Report Status PENDING  Incomplete  MRSA PCR Screening     Status: None   Collection Time: 07/04/15  3:45 PM  Result Value Ref Range Status   MRSA by PCR NEGATIVE NEGATIVE Final    Comment:        The GeneXpert MRSA Assay (FDA approved for NASAL specimens only), is one component of a comprehensive MRSA colonization surveillance program. It is not intended to diagnose MRSA infection nor to guide or monitor treatment for MRSA infections.     Anti-infectives    Start     Dose/Rate Route Frequency Ordered Stop   07/04/15 2330  vancomycin (VANCOCIN) IVPB 750 mg/150 ml premix     750 mg 150 mL/hr over 60 Minutes Intravenous Every 24 hours 07/04/15 1034     07/04/15 1830  piperacillin-tazobactam (ZOSYN) IVPB 3.375 g     3.375 g 12.5 mL/hr over 240 Minutes Intravenous 3 times per day 07/04/15 1152     07/04/15 1400  piperacillin-tazobactam (ZOSYN) IVPB 3.375 g  Status:  Discontinued     3.375 g 12.5 mL/hr over 240 Minutes Intravenous 3 times per day 07/04/15 1016 07/04/15 1152   07/04/15 0915  vancomycin (VANCOCIN) IVPB 1000 mg/200 mL premix     1,000 mg 200 mL/hr over 60 Minutes Intravenous  Once 07/04/15 0901 07/04/15 1020   07/04/15 0915  piperacillin-tazobactam (ZOSYN) IVPB 3.375 g     3.375 g 12.5 mL/hr over 240 Minutes Intravenous  Once 07/04/15 0901 07/04/15 2022      Assessment: Pt is a 81 year old female on vancomycin for possible aspiration pna, from SNF. Pt is currently on vancomycin 750mg  q 24 hours. Pt CrCl has improved from 3532ml/min to 2640ml/min.  Scr =1.14.  Ke=0.038 T1/2=18hr vd=49  Goal of Therapy:  Vancomycin trough level 15-20 mcg/ml  Plan:  Measure antibiotic drug levels at steady state Follow up culture results Will increase dose to 1g q 24 hours. measure trough on 11/19 @ 0030. may not be quite steady state  Correction trough on 11/18 @ 2300.  Lakayla Barrington D Muath Hallam 07/06/2015,12:39 PM

## 2015-07-06 NOTE — Plan of Care (Addendum)
Problem: Education: Goal: Knowledge of [place] [facility] Education information/materials will improve Outcome: Progressing Patient lethargic this shift.  Received IV antibiotics (Zosyn and Vanco) and IV steroids for pneumonia.  Blood sugars continue to be elevated b/c of steroids and breathing tx.Patient educated on reasons for insulin coverage.  Problem: Safety: Goal: Ability to remain free from injury will improve Outcome: Progressing Patient currently on bedrest. She has generalized weakness and plantar and dorsiflexion are weak. Patient on high fall precautions.Bed in lowest position with wheels locked and call bell within reach. Bed alarm on throughout shift. Patient free from injury this shift.  Problem: Skin Integrity: Goal: Risk for impaired skin integrity will decrease Outcome: Progressing Pink foam dressing on sacrum changed this shift due to wetness.  Hourly rounding for toileting and additional needs.  Patient turned every two hours. Patient is more lethargic this shift than last night.

## 2015-07-06 NOTE — Plan of Care (Signed)
Problem: Pain Managment: Goal: General experience of comfort will improve Outcome: Progressing Pt has had no obvious signs of pain. No pain meds given today

## 2015-07-06 NOTE — Plan of Care (Signed)
Problem: Bowel/Gastric: Goal: Will not experience complications related to bowel motility Outcome: Progressing No obvious complication noted.

## 2015-07-06 NOTE — Progress Notes (Signed)
Speech Language Pathology Treatment: Dysphagia  Patient Details Name: Leslie Savage Greulich MRN: [id number] DOB: 05/04/1928 Today's Date: 07/06/2015 Time: [id number] SLP Time Calculation (min) (ACUTE ONLY): 20 min  Assessment / Plan / Recommendation Clinical Impression  Pt presented with baseline confusion and agitation (has a known history of dementia). Pt's current cognitive status continues to put her at an increased risk of aspiration.   Pt consumed 6+ ounces thin liquid, and 10+ trials of puree with one immediate cough noted on one trial after taking a pill in applesauce with NSG. Pt was instructed to cough hard by ST. After this one cough, Pt did not exhibit any other immediate or overt s/s of aspiration. Pt did remove one pill from mouth and required cueing and a liquid wash down after re-administration of pill with puree (oral deficit).  No other oral deficits noted.   Pt requires hand over hand assistance in consuming thin liquids with a straw. Feeder should keep fingers around straw and pinch to control volume if impulsivity is observed. Pt consumed multiple sips with ST, however please utilize strict aspiration precautions at this time. To ensure cognitive awareness to task, Pt should hold the cup herself (this increases safety in consuming thin liquids and prepares Pt for the task -- she will need assistance in holding the cup, again - hand over hand). Meds to be provided crushed in puree. Use a liquid washdown after med administration. NSG was present during treatment and agrees with approach.   HPI HPI: Leslie Savage Sandt is a 81 y.o. female has a past medical history significant for HTN, DM, CKD, and bipolar D/O now with acute respiratory failure due to presumed aspiration pneumonia. Pt is from SNF and is DNR. Not coherent in ER. Son is present. In ER, pt noted to be hypoxic, febrile, tachycardic with a leukocytosis. She is now admitted for further evaluation.      SLP Plan  Continue with  current plan of care     Recommendations  Diet recommendations: Dysphagia 1 (puree);Thin liquid Liquids provided via: Straw (hand over hand, pinch straw as necessary to control volume) Medication Administration: Crushed with puree Supervision: Staff to assist with self feeding;Full supervision/cueing for compensatory strategies Compensations: Small sips/bites;Slow rate;Minimize environmental distractions;Follow solids with liquid Postural Changes and/or Swallow Maneuvers: Seated upright 90 degrees             Oral Care Recommendations: Oral care BID;Staff/trained caregiver to provide oral care Plan: Continue with current plan of care   Aziel Morgan 07/06/2015, 11:16 AM

## 2015-07-06 NOTE — Plan of Care (Signed)
Problem: Education: Goal: Knowledge of [place] [facility] Education information/materials will improve Outcome: Not Progressing Pt is unable to comprehend most education provided. Pt's son did visit today, general education and progress disccussed with him.

## 2015-07-06 NOTE — Progress Notes (Signed)
[facility] -  at [facility]   PATIENT NAME: Leslie Savage    MR#:  [id number]  DATE OF BIRTH:  05-01-1928  SUBJECTIVE: 81 year old female patient admitted for aspiration pneumonia.afebrile today..                                         CHIEF COMPLAINT:   Chief Complaint  Patient presents with  . Aspiration    REVIEW OF SYSTEMS:   Review of Systems  Unable to perform ROS: acuity of condition   CONSTITUTIONAL: No fever, fatigue or weakness.  EYES: No blurred or double vision.  EARS, NOSE, AND THROAT: No tinnitus or ear pain.  RESPIRATORY: No cough, shortness of breath, wheezing or hemoptysis.  CARDIOVASCULAR: No chest pain, orthopnea, edema.  GASTROINTESTINAL: No nausea, vomiting, diarrhea or abdominal pain.  GENITOURINARY: No dysuria, hematuria.  ENDOCRINE: No polyuria, nocturia,  HEMATOLOGY: No anemia, easy bruising or bleeding SKIN: No rash or lesion. MUSCULOSKELETAL: No joint pain or arthritis.   NEUROLOGIC: No tingling, numbness, weakness.  PSYCHIATRY: No anxiety or depression.   DRUG ALLERGIES:   Allergies  Allergen Reactions  . Ciprocin-Fluocin-Procin [Fluocinolone] Other (See Comments)    Unknown reaction  . Risperdal [Risperidone] Other (See Comments)    Unknown reaction  . Rosuvastatin Other (See Comments)    Unknown reaction    VITALS:  Blood pressure 160/67, pulse 73, temperature 98.7 F (37.1 C), temperature source Oral, resp. rate 18, height  (1.702 m), weight 83.19 kg (183 lb 6.4 oz), SpO2 100 %.  PHYSICAL EXAMINATION:  GENERAL:  81 y.o.-year-old patient lying in the bed with no acute distress.  EYES: Pupils equal, round, reactive to light and accommodation. No scleral icterus. Extraocular muscles intact.  HEENT: Head atraumatic, normocephalic. Oropharynx and nasopharynx clear.  NECK:  Supple, no jugular venous distention. No thyroid enlargement, no tenderness.  LUNGS: Normal breath sounds bilaterally, no  wheezing, rales,rhonchi or crepitation. No use of accessory muscles of respiration.  CARDIOVASCULAR: S1, S2 normal. No murmurs, rubs, or gallops.  ABDOMEN: Soft, nontender, nondistended. Bowel sounds present. No organomegaly or mass.  EXTREMITIES: Bilateral pitting edema. NEUROLOGIC: Cranial nerves II through XII are intact. Muscle strength 5/5 in all extremities. Sensation intact. Gait not checked.  PSYCHIATRIC: TConfused SKIN: No obvious rash, lesion, or ulcer.    LABORATORY PANEL:   CBC  Recent Labs Lab 07/04/15 2352  WBC 11.8*  HGB 9.8*  HCT 28.3*  PLT 173   ------------------------------------------------------------------------------------------------------------------  Chemistries   Recent Labs Lab 07/04/15 0911  07/06/15 0813  NA 136  < > 135  K 4.5  < > 3.7  CL 101  < > 105  CO2 24  < > 24  GLUCOSE 369*  < > 193*  BUN 27*  < > 38*  CREATININE 1.24*  < > 1.14*  CALCIUM 9.8  < > 8.2*  AST 19  --   --   ALT 11*  --   --   ALKPHOS 63  --   --   BILITOT 1.4*  --   --   < > = values in this interval not displayed. ------------------------------------------------------------------------------------------------------------------  Cardiac Enzymes  Recent Labs Lab 07/04/15 2352  TROPONINI 0.05*   ------------------------------------------------------------------------------------------------------------------  RADIOLOGY:  Portable Chest 1 View  07/05/2015  CLINICAL DATA:  Aspiration pneumonia, acute respiratory failure EXAM: PORTABLE CHEST 1 VIEW COMPARISON:  Portable chest x-ray  of July 04, 2015 FINDINGS: The lungs are adequately inflated. The interstitial markings remain mildly increased. The left retrocardiac region exhibits patchy density. There is no large pleural effusion. There is no pneumothorax. The heart and is top-normal in size. The central pulmonary vascularity remains engorged. The bony thorax exhibits no acute abnormality. There are old rib  deformities bilaterally. IMPRESSION: Fairly stable appearance of the chest since yesterday's study. There is minimal left basilar subsegmental atelectasis which may reflect recent aspiration. Mild central pulmonary vascular congestion persists as well. Electronically Signed   By: David  SwazilandJordan M.D.   On: 07/05/2015 07:45    EKG:   Orders placed or performed during the hospital encounter of 07/04/15  . EKG 12-Lead  . EKG 12-Lead  . ED EKG 12-Lead  . ED EKG 12-Lead    ASSESSMENT AND PLAN:   *1 aspiration pneumonia: Patient started on IV vancomycin, Zosyn, Solu-Medrol. Started on dysphagia diet. #2. Acute respiratory failure hypoxia due to pneumonia and a new oxygen, follow blood cultures, continue IV antibiotics.  Improving clinically.  #3 diabetes mellitus type 2 ; seen by diabetic nurse, continue Lantus 15 units daily and the insulin coverage. Check hemoglobin A1c as well. Hold Actos and also metformin today blood glucose. Overall better .  #4 .sepsis with lactic acidosis secondary to  Pneumonia;/UTI; And cultures are showing more than 100,000 colonies of gram-negative rods, follow full  culture data ,continue IV abx,iv fluids 5, history of bipolar disorder continue home medication with Depakote, Prolixin, Cymbalta History of hypertension controlled; continue Coreg. History of chronic kidney disease stage III; stable  Disposition to [facility] when medically ready/ All the records are reviewed and case discussed with Care Management/Social Workerr. Management plans discussed with the patient, family and they are in agreement.  CODE STATUS: DNR  TOTAL TIME TAKING CARE OF THIS PATIENT: 35 minutes.   POSSIBLE D/C IN 1-2 DAYS, DEPENDING ON CLINICAL CONDITION.   Katha HammingKONIDENA,Alfreida Steffenhagen M.D on 07/06/2015 at 11:28 AM  Between 7am to 6pm - Pager - [phone]  After 6pm go to [email or web address] - password [email or web address] [facility]  [facility]  Office  [phone]  CC: Primary care  physician; No primary care provider on file.   Note: This dictation was prepared with Dragon dictation along with smaller phrase technology. Any transcriptional errors that result from this process are unintentional.

## 2015-07-07 ENCOUNTER — Inpatient Hospital Stay: Payer: Medicare Other

## 2015-07-07 LAB — GLUCOSE, CAPILLARY
GLUCOSE-CAPILLARY: 185 mg/dL — AB (ref 65–99)
GLUCOSE-CAPILLARY: 177 mg/dL — AB (ref 65–99)
GLUCOSE-CAPILLARY: 233 mg/dL — AB (ref 65–99)
GLUCOSE-CAPILLARY: 187 mg/dL — AB (ref 65–99)
GLUCOSE-CAPILLARY: 182 mg/dL — AB (ref 65–99)
Glucose-Capillary: 215 mg/dL — ABNORMAL HIGH (ref 65–99)

## 2015-07-07 MED ORDER — INSULIN GLARGINE 100 UNIT/ML ~~LOC~~ SOLN
17.0000 [IU] | Freq: Every day | SUBCUTANEOUS | Status: DC
  Administered 2015-07-08: 17 [IU] via SUBCUTANEOUS
  Filled 2015-07-07 (×2): qty 0.17

## 2015-07-07 MED ORDER — IPRATROPIUM-ALBUTEROL 0.5-2.5 (3) MG/3ML IN SOLN: 3.0000 mL | RESPIRATORY_TRACT | Status: DC | PRN

## 2015-07-07 MED ORDER — HYDRALAZINE HCL 20 MG/ML IJ SOLN
10.0000 mg | Freq: Once | INTRAMUSCULAR | Status: AC
  Administered 2015-07-07: 14:00:00 10 mg via INTRAVENOUS
  Filled 2015-07-07: qty 1

## 2015-07-07 MED ORDER — METHYLPREDNISOLONE SODIUM SUCC 40 MG IJ SOLR
40.0000 mg | Freq: Two times a day (BID) | INTRAMUSCULAR | Status: DC
  Administered 2015-07-07 – 2015-07-08 (×2): 40 mg via INTRAVENOUS
  Filled 2015-07-07 (×2): qty 1

## 2015-07-07 MED ORDER — CARVEDILOL 25 MG PO TABS
25.0000 mg | ORAL_TABLET | Freq: Two times a day (BID) | ORAL | Status: DC
  Administered 2015-07-07 – 2015-07-08 (×2): 25 mg via ORAL
  Filled 2015-07-07 (×2): qty 1

## 2015-07-07 NOTE — Plan of Care (Signed)
Problem: Education: Goal: Knowledge of [place] [facility] Education information/materials will improve Outcome: Progressing Patient more alert this shift. Received IV antibiotics (Zosyn and Vanco) and IV steroids for pneumonia. Blood sugars continue to be elevated b/c of steroids and breathing tx.Patient continually reoriented and educated on reasons for insulin coverage.  Problem: Safety: Goal: Ability to remain free from injury will improve Outcome: Progressing Patient currently on bedrest. She has generalized weakness and plantar and dorsiflexion are weak. Patient on high fall precautions.Bed in lowest position with wheels locked and call bell within reach. Bed alarm on throughout shift. Patient free from injury this shift.  Problem: Skin Integrity: Goal: Risk for impaired skin integrity will decrease Outcome: Progressing Pink foam dressing on sacrum changed on day shift remained clean, dry and intact this shift. Hourly rounding for toileting and additional needs. Patient turned every two hours.

## 2015-07-07 NOTE — Clinical Social Work Note (Signed)
Clinical Social Worker is continuing to follow for discharge planning. CSW updated facility and provide more information when it is determined what services are needed as there is a Palliative Care consult. Pt is able to return when stable. CSW continue to follow.   Dede QuerySarah Trenia Tennyson, MSW, LCSW

## 2015-07-07 NOTE — Plan of Care (Signed)
Problem: Spiritual Needs Goal: Ability to function at adequate level Outcome: Not Met (add Reason) Pt unable to express needs - treated w/care and compassion.  Problem: Education: Goal: Knowledge of [place] [facility] Education information/materials will improve Outcome: Not Met (add Reason) Pt unable to receive education - not oriented.  Son visited today and is involved in care mgmt.   Problem: Pain Managment: Goal: General experience of comfort will improve Outcome: Progressing No s/sx of pain - no pain medications given.   Problem: Skin Integrity: Goal: Risk for impaired skin integrity will decrease Outcome: Progressing No skin breakdown noted.  Prophylactic sacral dressing in place. Heels were noted to be reddened and slightly boggy - pink foam dressings placed and heels floated.   Problem: Bowel/Gastric: Goal: Will not experience complications related to bowel motility Outcome: Progressing No obvious complication noted.  Pt is incontinent.  No BM since 11/14.

## 2015-07-07 NOTE — Care Management Important Message (Signed)
Important Message  Patient Details  Name: Leslie Savage MRN: [id number] Date of Birth: 04/23/1928   Medicare Important Message Given:  Yes    Gwenette GreetBrenda S Anthem Frazer, RN 07/07/2015, 1:57 PM

## 2015-07-07 NOTE — Progress Notes (Signed)
[facility] - [place] at [facility]   PATIENT NAME: Leslie Savage    MR#:  [id number]  DATE OF BIRTH:  10/21/1927  SUBJECTIVE: 81 year old female patient admitted for aspiration pneumonia.afebrile today..  Tolerating a dysphagia 1 diet.     Has some confusion at baseline but responds appropriately                                  CHIEF COMPLAINT:   Chief Complaint  Patient presents with  . Aspiration    REVIEW OF SYSTEMS:   ROS CONSTITUTIONAL: No fever, fatigue or weakness.  EYES: No blurred or double vision.  EARS, NOSE, AND THROAT: No tinnitus or ear pain.  RESPIRATORY: No cough, shortness of breath, wheezing or hemoptysis.  CARDIOVASCULAR: No chest pain, orthopnea, edema.  GASTROINTESTINAL: No nausea, vomiting, diarrhea or abdominal pain.  GENITOURINARY: No dysuria, hematuria.  ENDOCRINE: No polyuria, nocturia,  HEMATOLOGY: No anemia, easy bruising or bleeding SKIN: No rash or lesion. MUSCULOSKELETAL: No joint pain or arthritis.   NEUROLOGIC: No tingling, numbness, weakness.  PSYCHIATRY: No anxiety or depression.   DRUG ALLERGIES:   Allergies  Allergen Reactions  . Ciprocin-Fluocin-Procin [Fluocinolone] Other (See Comments)    Unknown reaction  . Risperdal [Risperidone] Other (See Comments)    Unknown reaction  . Rosuvastatin Other (See Comments)    Unknown reaction    VITALS:  Blood pressure 161/70, pulse 71, temperature 98.4 F (36.9 C), temperature source Oral, resp. rate 17, height  (1.702 m), weight 84.052 kg (185 lb 4.8 oz), SpO2 100 %.  PHYSICAL EXAMINATION:  GENERAL:  81 y.o.-year-old patient lying in the bed with no acute distress.  EYES: Pupils equal, round, reactive to light and accommodation. No scleral icterus. Extraocular muscles intact.  HEENT: Head atraumatic, normocephalic. Oropharynx and nasopharynx clear.  NECK:  Supple, no jugular venous distention. No thyroid enlargement, no tenderness.  LUNGS: Normal breath  sounds bilaterally, no wheezing, rales,rhonchi or crepitation. No use of accessory muscles of respiration.  CARDIOVASCULAR: S1, S2 normal. No murmurs, rubs, or gallops.  ABDOMEN: Soft, nontender, nondistended. Bowel sounds present. No organomegaly or mass.  EXTREMITIES: Bilateral pitting edema. NEUROLOGIC: Cranial nerves II through XII are intact. Muscle strength 5/5 in all extremities. Sensation intact. Gait not checked.  PSYCHIATRIC: TConfused SKIN: No obvious rash, lesion, or ulcer.    LABORATORY PANEL:   CBC  Recent Labs Lab 07/04/15 2352  WBC 11.8*  HGB 9.8*  HCT 28.3*  PLT 173   ------------------------------------------------------------------------------------------------------------------  Chemistries   Recent Labs Lab 07/04/15 0911  07/06/15 0813  NA 136  < > 135  K 4.5  < > 3.7  CL 101  < > 105  CO2 24  < > 24  GLUCOSE 369*  < > 193*  BUN 27*  < > 38*  CREATININE 1.24*  < > 1.14*  CALCIUM 9.8  < > 8.2*  AST 19  --   --   ALT 11*  --   --   ALKPHOS 63  --   --   BILITOT 1.4*  --   --   < > = values in this interval not displayed. ------------------------------------------------------------------------------------------------------------------  Cardiac Enzymes  Recent Labs Lab 07/04/15 2352  TROPONINI 0.05*   ------------------------------------------------------------------------------------------------------------------  RADIOLOGY:  No results found.  EKG:   Orders placed or performed during the hospital encounter of 07/04/15  . EKG 12-Lead  . EKG 12-Lead  .  ED EKG 12-Lead  . ED EKG 12-Lead    ASSESSMENT AND PLAN:   *1 aspiration pneumonia: Patient started on IV vancomycin, Zosyn, Solu-Medrol. Started on dysphagia diet. Likely improving. 2.resp failure hypoxia due to pneumonia ,, continue IV antibiotics.  Improving clinically.Repeat chest x-ray today.   #3 diabetes mellitus type 2 ; seen by diabetic nurse, continue  increase the Lantus  to 17 units daily because of high blood glucose levels, continue  insulin coverage.  Hold Actos and also metformin while hospitalized for blood glucose control with Lantus only.  .  #4 .sepsis with lactic acidosis secondary to  Pneumonia;/UTI; and culture that are right now shows 80,000 colonies of gram-negative rods. 5, history of bipolar disorder continue home medication with Depakote, Prolixin, Cymbalta History of hypertension controlled; continue Coreg. History of chronic kidney disease stage III; stable  Disposition to [facility] when medically ready/ All the records are reviewed and case discussed with Care Management/Social Workerr. Management plans discussed with the patient, family and they are in agreement.  CODE STATUS: DNR  TOTAL TIME TAKING CARE OF THIS PATIENT: 35 minutes.   POSSIBLE D/C IN 1-2 DAYS, DEPENDING ON CLINICAL CONDITION.   Katha HammingKONIDENA,Blasa Raisch M.D on 07/07/2015 at 12:25 PM  Between 7am to 6pm - Pager - [phone]  After 6pm go to [email or web address] - password [email or web address] [facility]  [facility]  Office  [phone]  CC: Primary care physician; No primary care provider on file.   Note: This dictation was prepared with Dragon dictation along with smaller phrase technology. Any transcriptional errors that result from this process are unintentional.

## 2015-07-08 LAB — GLUCOSE, CAPILLARY
GLUCOSE-CAPILLARY: 206 mg/dL — AB (ref 65–99)
Glucose-Capillary: 129 mg/dL — ABNORMAL HIGH (ref 65–99)
Glucose-Capillary: 129 mg/dL — ABNORMAL HIGH (ref 65–99)

## 2015-07-08 LAB — URINE CULTURE

## 2015-07-08 LAB — CULTURE, BLOOD (ROUTINE X 2)

## 2015-07-08 MED ORDER — PREDNISONE 10 MG (21) PO TBPK: 10.0000 mg | ORAL_TABLET | Freq: Every day | ORAL | Status: AC

## 2015-07-08 MED ORDER — INSULIN GLARGINE 100 UNIT/ML ~~LOC~~ SOLN: 17.0000 [IU] | Freq: Every day | SUBCUTANEOUS | Status: AC

## 2015-07-08 MED ORDER — INSULIN ASPART 100 UNIT/ML ~~LOC~~ SOLN: 0.0000 [IU] | SUBCUTANEOUS | Status: AC

## 2015-07-08 MED ORDER — ENSURE ENLIVE PO LIQD: 237.0000 mL | Freq: Three times a day (TID) | ORAL | Status: AC

## 2015-07-08 MED ORDER — AMOXICILLIN-POT CLAVULANATE 875-125 MG PO TABS: 1.0000 | ORAL_TABLET | Freq: Two times a day (BID) | ORAL | Status: AC

## 2015-07-08 MED ORDER — ASPIRIN 81 MG PO TBEC: 81.0000 mg | DELAYED_RELEASE_TABLET | Freq: Every day | ORAL | Status: AC

## 2015-07-08 MED ORDER — PREDNISONE 10 MG PO TABS: 10.0000 mg | ORAL_TABLET | Freq: Every day | ORAL | Status: DC

## 2015-07-08 MED ORDER — AMLODIPINE BESYLATE 10 MG PO TABS: 10.0000 mg | ORAL_TABLET | Freq: Every day | ORAL | Status: AC

## 2015-07-08 MED ORDER — BISACODYL 10 MG RE SUPP: 10.0000 mg | Freq: Every day | RECTAL | Status: AC | PRN

## 2015-07-08 NOTE — Progress Notes (Signed)
Speech Language Pathology Treatment: Dysphagia  Patient Details Name: Leslie Savage Grob MRN: [id number] DOB: 03/20/1928 Today's Date: 07/08/2015 Time: [id number] SLP Time Calculation (min) (ACUTE ONLY): 35 min  Assessment / Plan / Recommendation Clinical Impression  Pt seen for dysphagia tx session; toleration of po's. Pt has been accepting some po's mostly liquids and especially likes water. Often pt refuses po's despite max. Cues. Pt accepted po trials of thin liquids via straw w/ SLP consuming 3 sets of 3 sips b/f SLP pinched straw to limit impulsive/consecutive drinking. No overt s/s of aspiration noted; clear vocal quality noted b/t trials. Pt refused po trials of foods w/ SLP. NSG had just been able to get meds swallowed using ice cream but she had initially refused. Pt appears to be tolerating thin liquids and purees w/ no reported s/s of aspiration. Rec. Continue w/ current diet w/ aspiration precautions and monitoring sips of thin liquids using straw pinching when nec. Rec. Encouragement to take po's for nutrition/hydration. Dietician following for supplements(drinks). ST will f/u as needed; NSG updated and agreed.     HPI HPI: Leslie Savage Lovan is a 81 y.o. female has a past medical history significant for HTN, DM, CKD, and bipolar D/O now with acute respiratory failure due to presumed aspiration pneumonia. Unsure of pt's level of Cognition - it has been declined during this admission. Pt is from SNF and is DNR. No family has been present. Pt has only been taking sips of liquids w/ NSG; often refusing other po's of food and Meds w/ NSG; max. cues and support required for pt to take her Meds per report.       SLP Plan  Continue with current plan of care     Recommendations  Diet recommendations: Dysphagia 1 (puree);Thin liquid Liquids provided via: Cup;Straw Medication Administration: Whole meds with puree (or crushed if nec for easier swallowing) Supervision: Staff to assist with self  feeding;Full supervision/cueing for compensatory strategies;Trained caregiver to feed patient Compensations: Minimize environmental distractions;Slow rate;Small sips/bites;Lingual sweep for clearance of pocketing;Follow solids with liquid Postural Changes and/or Swallow Maneuvers: Seated upright 90 degrees              Oral Care Recommendations: Oral care BID;Staff/trained caregiver to provide oral care Follow up Recommendations: Skilled Nursing facility Plan: Continue with current plan of care    Jerilynn SomKatherine Watson, MS, CCC-SLP  Watson,Katherine 07/08/2015, 12:56 PM

## 2015-07-08 NOTE — Progress Notes (Signed)
EMS present for pt discharge; discharge packet given to EMS personnel to take to the facility; pt discharged via stretcher by EMS personnel to [facility]

## 2015-07-08 NOTE — Plan of Care (Signed)
Problem: Education: Goal: Knowledge of [facility] Education information/materials will improve Outcome: Progressing Pt is confused, educated to call for assistance. Hourly rounding performed on patient.  Pt is incontinent of urine. q4 sugar checks continued, coverage given each time.   Problem: Pain Managment: Goal: General experience of comfort will improve Outcome: Progressing Pt has no s/s of pain or discomfort.

## 2015-07-08 NOTE — Discharge Summary (Signed)
Leslie Savage, is a 81 y.o. female  DOB 08/12/1928  MRN [id number].  Admission date:  07/04/2015  Admitting Physician  Epifanio Lesches, MD  Discharge Date:  07/08/2015   Primary MD  No primary care provider on file.  Recommendations for primary care physician for things to follow:  Follow-up with primary doctor from [facility] in 2-3 days.   Admission Diagnosis  Sepsis, due to unspecified organism [facility]) [A41.9] Aspiration pneumonia of left lower lobe, unspecified aspiration pneumonia type ([place]) [J69.0]   Discharge Diagnosis  Sepsis, due to unspecified organism [facility]) [A41.9] Aspiration pneumonia of left lower lobe, unspecified aspiration pneumonia type ([place]) [J69.0]   Active Problems:   Aspiration pneumonia ([place])   Acute respiratory failure ([place])      Past Medical History  Diagnosis Date  . Bipolar 1 disorder ([place])   . Kidney disease   . Hypothyroidism vitamin b12  . Anemia   . Hyperlipidemia   . MDD (major depressive disorder) ([place])   . TIA (transient ischemic attack)   . Cerebral infarction ([place])   . Schizophrenia ([place])   . Osteoporosis   . Peripheral vascular disease [facility])     Past Surgical History  Procedure Laterality Date  . Joint replacement         History of present illness and  Hospital Course:     Kindly see H&P for history of present illness and admission details, please review complete Labs, Consult reports and Test reports for all details in brief  HPI  from the history and physical done on the day of admission  81 year old female patient with history of hypertension, chronic kidney disease stage III, bipolar disorder brought in from [place] secondary to respiratory distress. Patient found to have respiratory failure secondary to aspiration pneumonia. Patient has baseline  dementia unable to obtain good history.  Hospital Course  #1 aspiration pneumonia with acute respiratory failure: Patient was kept nothing by mouth, started on IV antibiotics, IV hydration. Patient repeated showed no congestive heart failure, mild subsegmental atelectasis in the left lung base. Patient blood cultures are negative. Initial white count 18.2 improved to 11.8.yesterday.seen by speech therapy,started  On dysphagia  1 diet. Patient tolerating the diet. Hypoxic, O2 sats 99% on 1 L. Patient will go to [facility] with by mouth Augmentin to finish the course and continue aspiration precautions. Patient was given steroids during hospital stay I did I give prednisone tapering course for discharge.  #2 hypertension: Uncontrolled. Coreg dose is  increased to 25 mg twice a day. I am adding Norvasc 10 mg also in addition to Coreg.  #3 diabetes mellitus type 2. Patient actos and  metformin are stopped secondary to advanced age and also renal disease. Patient can take Lantus and sliding scale with coverage. She is on Lantus 17 units daily. 4. Dementia with  Mood disorder;on Depakote and Fluphenazine,  Follow UP      Discharge Instructions  and  Discharge Medications        Medication List    STOP taking these medications        pioglitazone-metformin 15-500 MG tablet  Commonly known as:  ACTOPLUS MET      TAKE these medications        acetaminophen 325 MG tablet  Commonly known as:  TYLENOL  Take 650 mg by mouth every 4 (four) hours as needed for mild pain, moderate pain or fever.     amoxicillin-clavulanate 875-125 MG tablet  Commonly known as:  AUGMENTIN  Take 1 tablet by mouth 2 (two) times daily.     artificial tears Oint ophthalmic ointment  Place 1 application into both eyes at bedtime.     aspirin 81 MG EC tablet  Take 1 tablet (81 mg total) by mouth daily.     bisacodyl 10 MG suppository  Commonly known as:  DULCOLAX  Place 1 suppository (10 mg total)  rectally daily as needed for moderate constipation.     calcium-vitamin D 500-200 MG-UNIT tablet  Commonly known as:  OSCAL WITH D  Take 1 tablet by mouth 2 (two) times daily.     carvedilol 12.5 MG tablet  Commonly known as:  COREG  Take 12.5 mg by mouth 2 (two) times daily.     divalproex 125 MG DR tablet  Commonly known as:  DEPAKOTE  Take 125 mg by mouth every 8 (eight) hours.     docusate sodium 100 MG capsule  Commonly known as:  COLACE  Take 100 mg by mouth every 12 (twelve) hours as needed (for stool softener).     DULoxetine 20 MG capsule  Commonly known as:  CYMBALTA  Take 20 mg by mouth daily. Take along with 30 mg capsule to equal 50 mg total.     feeding supplement (ENSURE ENLIVE) Liqd  Take 237 mLs by mouth 3 (three) times daily with meals.     ferrous sulfate 325 (65 FE) MG tablet  Take 325 mg by mouth daily.     fluPHENAZine 2.5 MG tablet  Commonly known as:  PROLIXIN  Take 2.5 mg by mouth daily.     fluPHENAZine decanoate 25 MG/ML injection  Commonly known as:  PROLIXIN  Inject 37.5 mg into the muscle every 14 (fourteen) days.     insulin aspart 100 UNIT/ML injection  Commonly known as:  novoLOG  Inject 0-9 Units into the skin every 4 (four) hours.     insulin glargine 100 UNIT/ML injection  Commonly known as:  LANTUS  Inject 0.17 mLs (17 Units total) into the skin daily.     levothyroxine 125 MCG tablet  Commonly known as:  SYNTHROID, LEVOTHROID  Take 125 mcg by mouth daily before breakfast.     lisinopril 20 MG tablet  Commonly known as:  PRINIVIL,ZESTRIL  Take 20 mg by mouth daily.     loratadine 10 MG tablet  Commonly known as:  CLARITIN  Take 10 mg by mouth daily as needed for allergies.     magnesium hydroxide 400 MG/5ML suspension  Commonly known as:  MILK OF MAGNESIA  Take 30 mLs by mouth at bedtime as needed for mild constipation or moderate constipation.     MAGOX 400 400 (241.3 MG) MG tablet  Generic drug:  magnesium oxide   Take 400 mg by mouth 2 (two) times daily.     omeprazole 20 MG tablet  Commonly known as:  PRILOSEC OTC  Take 20 mg by mouth 2 (two) times daily.     predniSONE 10 MG (21) Tbpk tablet  Commonly known as:  STERAPRED UNI-PAK 21 TAB  Take 1 tablet (10 mg total) by mouth daily. 3 tab daily for 3 days 2 tab daily for 2 days 1 tab daily for 2 days          Diet and Activity recommendation: See Discharge Instructions above   Consults obtained - speech Therapy   Major procedures and Radiology Reports - PLEASE review detailed and final reports for all details, in brief -  Dg Chest 1 View  07/07/2015  CLINICAL DATA:  SOB, aspiration pneumonia. Respiratory failure. Diabetic, HTN, sepsis. EXAM: CHEST 1 VIEW COMPARISON:  07/05/2015 FINDINGS: Numerous leads and wires project over the chest. Midline trachea. Cardiomegaly accentuated by AP portable technique. Atherosclerosis in the transverse aorta. Trace left pleural fluid is likely similar. No pneumothorax. Minimal motion degradation. No congestive failure. Similar mild subsegmental atelectasis at the left lung base. Remote right rib trauma. IMPRESSION: Small left pleural effusion with adjacent subsegmental atelectasis at the left lung base. Cardiomegaly without congestive failure. Electronically Signed   By: Abigail Miyamoto M.D.   On: 07/07/2015 15:44   Portable Chest 1 View  07/05/2015  CLINICAL DATA:  Aspiration pneumonia, acute respiratory failure EXAM: PORTABLE CHEST 1 VIEW COMPARISON:  Portable chest x-ray of July 04, 2015 FINDINGS: The lungs are adequately inflated. The interstitial markings remain mildly increased. The left retrocardiac region exhibits patchy density. There is no large pleural effusion. There is no pneumothorax. The heart and is top-normal in size. The central pulmonary vascularity remains engorged. The bony thorax exhibits no acute abnormality. There are old rib deformities bilaterally. IMPRESSION: Fairly stable  appearance of the chest since yesterday's study. There is minimal left basilar subsegmental atelectasis which may reflect recent aspiration. Mild central pulmonary vascular congestion persists as well. Electronically Signed   By: David  Martinique M.D.   On: 07/05/2015 07:45   Dg Chest Port 1 View  07/04/2015  CLINICAL DATA:  Low O2 sats. Aspiration last night per nursing home. EXAM: PORTABLE CHEST 1 VIEW COMPARISON:  Chest x-ray dated 06/15/2014. FINDINGS: Patchy airspace opacities at the left lung base may be related to the given history of aspiration. Alternatively atelectasis. The mild interstitial coarsening is stable compared to multiple prior studies, presumably mild chronic fibrosis. Heart size is upper normal. Overall cardiomediastinal silhouette is stable in size and configuration. Atherosclerotic calcifications again noted at the aortic knob. No large pleural effusion. No pneumothorax. No acute osseous abnormality. IMPRESSION: Patchy airspace opacities at the left lung base which may be related to the history of recent aspiration. Pneumonia or atelectasis are also possibilities. Chronic interstitial thickening bilaterally suggesting some degree of interstitial fibrosis. Electronically Signed   By: Franki Cabot M.D.   On: 07/04/2015 09:32    Micro Results     Recent Results (from the past 240 hour(s))  Blood Culture (routine x 2)     Status: None   Collection Time: 07/04/15  9:11 AM  Result Value Ref Range Status   Specimen Description BLOOD RIGHT AC  Final   Special Requests BOTTLES DRAWN AEROBIC AND ANAEROBIC  1CC  Final   Culture  Setup Time   Final    GRAM POSITIVE COCCI IN CLUSTERS AEROBIC BOTTLE ONLY CRITICAL RESULT CALLED TO, READ BACK BY AND VERIFIED WITHEarlie Server AT 4315 07/05/15 CTJ    Culture   Final    COAGULASE NEGATIVE STAPHYLOCOCCUS AEROBIC BOTTLE ONLY Results consistent with contamination.    Report Status 07/08/2015 FINAL  Final  Blood Culture (routine x  2)     Status: None (Preliminary result)   Collection Time: 07/04/15  9:11 AM  Result Value Ref Range Status   Specimen Description BLOOD LEFT HAND  Final   Special Requests BOTTLES DRAWN AEROBIC AND ANAEROBIC  1CC  Final   Culture NO GROWTH 3 DAYS  Final   Report Status PENDING  Incomplete  Urine culture     Status: None   Collection Time: 07/04/15  9:42 AM  Result Value Ref Range Status   Specimen Description URINE, RANDOM  Final   Special Requests NONE  Final   Culture   Final    MULTIPLE SPECIES PRESENT, SUGGEST RECOLLECTION Probably contamination with fecal flora.    Report Status 07/08/2015 FINAL  Final  MRSA PCR Screening     Status: None   Collection Time: 07/04/15  3:45 PM  Result Value Ref Range Status   MRSA by PCR NEGATIVE NEGATIVE Final    Comment:        The GeneXpert MRSA Assay (FDA approved for NASAL specimens only), is one component of a comprehensive MRSA colonization surveillance program. It is not intended to diagnose MRSA infection nor to guide or monitor treatment for MRSA infections.        Today   Subjective:   Leslie Savage today ,sen,no fever.stable  For  discharge.  Objective:   Blood pressure 170/65, pulse 60, temperature 98 F (36.7 C), temperature source Axillary, resp. rate 18, height 5' 7"  (1.702 m), weight 83.643 kg (184 lb 6.4 oz), SpO2 99 %.   Intake/Output Summary (Last 24 hours) at 07/08/15 1113 Last data filed at 07/08/15 1007  Gross per 24 hour  Intake    240 ml  Output      0 ml  Net    240 ml    Exam Awake Alert, Oriented x 3, No new F.N deficits, Normal affect [place].AT,PERRAL Supple Neck,No JVD, No cervical lymphadenopathy appriciated.  Symmetrical Chest wall movement, Good air movement bilaterally, CTAB RRR,No Gallops,Rubs or new Murmurs, No Parasternal Heave +ve B.Sounds, Abd Soft, Non tender, No organomegaly appriciated, No rebound -guarding or rigidity. No Cyanosis, Clubbing or edema, No new Rash or  bruise  Data Review   CBC w Diff: Lab Results  Component Value Date   WBC 11.8* 07/04/2015   WBC 7.6 06/17/2014   HGB 9.8* 07/04/2015   HGB 9.4* 06/18/2014   HCT 28.3* 07/04/2015   HCT 30.6* 06/17/2014   PLT 173 07/04/2015   PLT 137* 06/17/2014   LYMPHOPCT 9 07/04/2015   LYMPHOPCT 15.9 06/17/2014   MONOPCT 8 07/04/2015   MONOPCT 9.6 06/17/2014   EOSPCT 0 07/04/2015   EOSPCT 0.3 06/17/2014   BASOPCT 0 07/04/2015   BASOPCT 0.2 06/17/2014    CMP: Lab Results  Component Value Date   NA 135 07/06/2015   NA 135* 06/19/2014   K 3.7 07/06/2015   K 3.6 06/19/2014   CL 105 07/06/2015   CL 99 06/19/2014   CO2 24 07/06/2015   CO2 28 06/19/2014   BUN 38* 07/06/2015   BUN 14 06/19/2014   CREATININE 1.14* 07/06/2015   CREATININE 0.81 06/19/2014   PROT 7.7 07/04/2015   PROT 7.2 06/15/2014   ALBUMIN 3.9 07/04/2015   ALBUMIN 3.5 06/15/2014   BILITOT 1.4* 07/04/2015   BILITOT 0.5 06/15/2014   ALKPHOS 63 07/04/2015   ALKPHOS 87 06/15/2014   AST 19 07/04/2015   AST 22 06/15/2014   ALT 11* 07/04/2015   ALT 19 06/15/2014  .   Total Time in preparing paper work, data evaluation and todays exam - 7 minutes  Othell Jaime M.D on 07/08/2015 at 11:13 AM    Note: This dictation was prepared with Dragon dictation along with smaller phrase technology. Any transcriptional errors that result from this process are unintentional.

## 2015-07-08 NOTE — Progress Notes (Signed)
911 called for nonemergency transport to [facility]

## 2015-07-08 NOTE — Progress Notes (Signed)
MD order received to discharge pt to [facility] today; Sarah MSW previously prepared discharge packet for EMS personnel to take to the facility; telephone report called to [facility] [phone] for Room 314B, spoke to [place], no questions voiced at this time; pt's discharge pending arrival of EMS for nonemergency transport

## 2015-07-08 NOTE — Progress Notes (Signed)
Patient refusing medications.  After numerous attempts to get patient to take pills in sugar-free chocolate ice cream, the patient only took one whole lisinopril and half of a carvedilol (had to break in half due to size of pill).  The patient repeatedly spit out pills and then clamped her mouth shut after telling the student nurse, "Damn you.  Go to hell."  Secondary RN and instructor, S. Holt, deferred administration at this time.  Primary RN, Melina Schoolsobyn T., made aware.

## 2015-07-08 NOTE — Clinical Social Work Note (Signed)
Pt is ready for discharge to [facility] today. CSW spoke to pt's son, who is in agreement. Facility has received all information and is ready to admit pt. RN will call report and EMS will provide transportation. CSW is signing off as no further needs identified.   Dede QuerySarah Rylend Pietrzak, MSW, LCSW Clinical Social Worker  [phone]

## 2015-07-09 LAB — CULTURE, BLOOD (ROUTINE X 2): CULTURE: NO GROWTH

## 2015-10-18 ENCOUNTER — Emergency Department
Admission: EM | Admit: 2015-10-18 | Discharge: 2015-10-20 | Disposition: E | Payer: Medicare Other | Attending: Emergency Medicine | Admitting: Emergency Medicine

## 2015-10-20 DIAGNOSIS — 419620001 Death: Secondary | SNOMED CT | POA: Diagnosis not present

## 2015-10-20 NOTE — ED Notes (Addendum)
Pt presents to ED dead on arrival from [facility]. Per medic [employer] , Pt was found slumped over on bed with agonal breathing while eating dinner; started choking. EMT performed Hemlich maneuver ; heart rate ranged from rate of 30-80 bpm. EMT attempted to obtain airway, then HR stopped en route at 18:14. DNR on board

## 2015-10-20 NOTE — Progress Notes (Signed)
°   10/26/2015 2100  Clinical Encounter Type  Visited With Family  Visit Type Death  Referral From Nurse  Consult/Referral To Chaplain  Spiritual care visit with patient's son. Chaplain [employer] Ext [phone]

## 2015-10-20 DEATH — deceased
